# Patient Record
Sex: Male | Born: 1965 | Race: Black or African American | Hispanic: No | Marital: Married | Smoking: Never smoker
Health system: Southern US, Community
[De-identification: ages and names within clinical notes are randomized; demographics above are authoritative.]

## PROBLEM LIST (undated history)

## (undated) ENCOUNTER — Ambulatory Visit

## (undated) DIAGNOSIS — E119 Type 2 diabetes mellitus without complications: Secondary | ICD-10-CM

## (undated) DIAGNOSIS — E079 Disorder of thyroid, unspecified: Secondary | ICD-10-CM

## (undated) DIAGNOSIS — E785 Hyperlipidemia, unspecified: Secondary | ICD-10-CM

## (undated) DIAGNOSIS — I10 Essential (primary) hypertension: Secondary | ICD-10-CM

## (undated) HISTORY — DX: Essential (primary) hypertension: I10

## (undated) HISTORY — PX: DENTAL SURGERY: SHX609

## (undated) HISTORY — DX: Hyperlipidemia, unspecified: E78.5

---

## 2008-08-15 ENCOUNTER — Emergency Department (HOSPITAL_BASED_OUTPATIENT_CLINIC_OR_DEPARTMENT_OTHER): Admission: EM | Admit: 2008-08-15 | Discharge: 2008-08-15 | Payer: Self-pay | Admitting: Emergency Medicine

## 2008-11-19 ENCOUNTER — Emergency Department (HOSPITAL_BASED_OUTPATIENT_CLINIC_OR_DEPARTMENT_OTHER): Admission: EM | Admit: 2008-11-19 | Discharge: 2008-11-19 | Payer: Self-pay | Admitting: Emergency Medicine

## 2008-11-19 ENCOUNTER — Ambulatory Visit: Payer: Self-pay | Admitting: Diagnostic Radiology

## 2009-02-01 ENCOUNTER — Ambulatory Visit: Payer: Self-pay | Admitting: Family Medicine

## 2009-02-01 DIAGNOSIS — I10 Essential (primary) hypertension: Secondary | ICD-10-CM | POA: Insufficient documentation

## 2009-02-01 DIAGNOSIS — H40059 Ocular hypertension, unspecified eye: Secondary | ICD-10-CM

## 2009-02-01 DIAGNOSIS — E039 Hypothyroidism, unspecified: Secondary | ICD-10-CM | POA: Insufficient documentation

## 2009-02-01 DIAGNOSIS — G609 Hereditary and idiopathic neuropathy, unspecified: Secondary | ICD-10-CM | POA: Insufficient documentation

## 2009-02-01 DIAGNOSIS — E119 Type 2 diabetes mellitus without complications: Secondary | ICD-10-CM

## 2009-02-01 DIAGNOSIS — K029 Dental caries, unspecified: Secondary | ICD-10-CM | POA: Insufficient documentation

## 2009-02-04 ENCOUNTER — Ambulatory Visit: Payer: Self-pay | Admitting: Family Medicine

## 2009-02-04 ENCOUNTER — Encounter: Payer: Self-pay | Admitting: Family Medicine

## 2009-02-06 ENCOUNTER — Encounter: Payer: Self-pay | Admitting: Family Medicine

## 2009-02-06 LAB — CONVERTED CEMR LAB
ALT: 13 units/L (ref 0–53)
AST: 17 units/L (ref 0–37)
Alkaline Phosphatase: 59 units/L (ref 39–117)
BUN: 14 mg/dL (ref 6–23)
Calcium: 9.2 mg/dL (ref 8.4–10.5)
Chloride: 104 meq/L (ref 96–112)
Creatinine, Ser: 1.19 mg/dL (ref 0.40–1.50)
HDL: 40 mg/dL (ref >39)
RBC: 4.45 M/uL (ref 4.22–5.81)
TSH: 14.534 microintl units/mL — ABNORMAL HIGH (ref 0.350–4.500)
Total Bilirubin: 0.5 mg/dL (ref 0.3–1.2)
Total CHOL/HDL Ratio: 4.8
VLDL: 13 mg/dL (ref 0–40)
WBC: 5.1 10*3/uL (ref 4.0–10.5)

## 2009-02-11 ENCOUNTER — Telehealth: Payer: Self-pay | Admitting: Family Medicine

## 2009-02-11 ENCOUNTER — Telehealth: Payer: Self-pay | Admitting: *Deleted

## 2009-02-11 DIAGNOSIS — E7849 Other hyperlipidemia: Secondary | ICD-10-CM | POA: Insufficient documentation

## 2009-02-11 DIAGNOSIS — E785 Hyperlipidemia, unspecified: Secondary | ICD-10-CM

## 2009-02-15 ENCOUNTER — Ambulatory Visit: Payer: Self-pay | Admitting: Family Medicine

## 2009-04-12 ENCOUNTER — Ambulatory Visit: Payer: Self-pay | Admitting: Family Medicine

## 2009-04-12 LAB — CONVERTED CEMR LAB: TSH: 0.01 microintl units/mL — ABNORMAL LOW (ref 0.350–4.500)

## 2009-04-15 ENCOUNTER — Telehealth: Payer: Self-pay | Admitting: Family Medicine

## 2009-04-18 ENCOUNTER — Telehealth: Payer: Self-pay | Admitting: Family Medicine

## 2009-06-14 ENCOUNTER — Ambulatory Visit: Payer: Self-pay | Admitting: Family Medicine

## 2009-06-14 DIAGNOSIS — E119 Type 2 diabetes mellitus without complications: Secondary | ICD-10-CM | POA: Insufficient documentation

## 2009-06-17 LAB — CONVERTED CEMR LAB
BUN: 15 mg/dL (ref 6–23)
Creatinine, Ser: 1.12 mg/dL (ref 0.40–1.50)
Glucose, Bld: 98 mg/dL (ref 70–99)
Potassium: 4.7 meq/L (ref 3.5–5.3)
TSH: 0.005 microintl units/mL — ABNORMAL LOW (ref 0.350–4.500)

## 2009-07-08 ENCOUNTER — Telehealth: Payer: Self-pay | Admitting: Family Medicine

## 2009-07-18 ENCOUNTER — Ambulatory Visit: Payer: Self-pay | Admitting: Family Medicine

## 2009-07-18 ENCOUNTER — Encounter: Payer: Self-pay | Admitting: Family Medicine

## 2009-07-22 ENCOUNTER — Telehealth: Payer: Self-pay | Admitting: Family Medicine

## 2009-09-13 ENCOUNTER — Ambulatory Visit: Payer: Self-pay | Admitting: Family Medicine

## 2009-09-13 ENCOUNTER — Encounter: Payer: Self-pay | Admitting: Family Medicine

## 2009-09-18 ENCOUNTER — Encounter: Payer: Self-pay | Admitting: Family Medicine

## 2009-10-03 ENCOUNTER — Telehealth (INDEPENDENT_AMBULATORY_CARE_PROVIDER_SITE_OTHER): Payer: Self-pay | Admitting: *Deleted

## 2009-10-25 ENCOUNTER — Encounter: Payer: Self-pay | Admitting: Family Medicine

## 2009-10-25 ENCOUNTER — Ambulatory Visit: Payer: Self-pay | Admitting: Family Medicine

## 2009-10-25 LAB — CONVERTED CEMR LAB: TSH: 0.088 microintl units/mL — ABNORMAL LOW (ref 0.350–4.500)

## 2009-10-28 ENCOUNTER — Telehealth: Payer: Self-pay | Admitting: Family Medicine

## 2009-11-14 ENCOUNTER — Telehealth: Payer: Self-pay | Admitting: Family Medicine

## 2009-11-26 ENCOUNTER — Telehealth: Payer: Self-pay | Admitting: *Deleted

## 2009-12-03 ENCOUNTER — Telehealth: Payer: Self-pay | Admitting: *Deleted

## 2009-12-04 ENCOUNTER — Ambulatory Visit: Payer: Self-pay | Admitting: Family Medicine

## 2009-12-04 DIAGNOSIS — K047 Periapical abscess without sinus: Secondary | ICD-10-CM | POA: Insufficient documentation

## 2009-12-27 ENCOUNTER — Encounter: Payer: Self-pay | Admitting: *Deleted

## 2009-12-27 ENCOUNTER — Ambulatory Visit: Payer: Self-pay | Admitting: Family Medicine

## 2009-12-27 ENCOUNTER — Encounter: Payer: Self-pay | Admitting: Family Medicine

## 2009-12-27 LAB — CONVERTED CEMR LAB
BUN: 13 mg/dL (ref 6–23)
CO2: 27 meq/L (ref 19–32)
Chloride: 103 meq/L (ref 96–112)
Hgb A1c MFr Bld: 7.7 %
Potassium: 4.4 meq/L (ref 3.5–5.3)

## 2009-12-30 ENCOUNTER — Encounter: Payer: Self-pay | Admitting: Family Medicine

## 2010-02-11 NOTE — Progress Notes (Signed)
Summary: waiting for pt to call back/see message/ts  Phone Note Outgoing Call   Call placed by: Paula Compton MD,  July 22, 2009 8:53 AM Call placed to: Patient Action Taken: Phone Call Completed Summary of Call: Called to patient, TSH is below detectable limit.  I left voice message.  I WISH TO ASK ABOUT THE DOSE OF THE LEVOTHYROXINE HE IS TAKING ON THE MED LIST IS DAILY.  IF HE IS TAKING THIS AS DIRECTED, I WOULD LIKE TO DROP DOWN TO DAILY AND RECHECK HIS TSH IN 6 TO 8 WEEKS.  Initial call taken by: Paula Compton MD,  July 22, 2009 8:54 AM  Follow-up for Phone Call        spoke with patient and gave him message from Dr. Mauricio Po. he has been taking 125 mcg daily of the thyroid medication. advised will call new RX  to MAP program at [facility]. called HD and they are not there today . will call back tomorrow. . Follow-up by: Theresia Lo RN,  July 22, 2009 2:58 PM  Additional Follow-up for Phone Call Additional follow up Details #1::        have called MAP program several  times and left messages X 3. Additional Follow-up by: Theresia Lo RN,  July 23, 2009 11:14 AM    New/Updated Medications: LEVOTHYROXINE SODIUM 75 MCG TABS (LEVOTHYROXINE SODIUM) taken one tablet daily Prescriptions: LEVOTHYROXINE SODIUM 75 MCG TABS (LEVOTHYROXINE SODIUM) taken one tablet daily  #30 x 1   Entered by:   Theresia Lo RN   Authorized by:   Paula Compton MD   Signed by:   Paula Compton MD on 07/24/2009   Method used:   Telephoned to ...       [facility] (retail)             [place], [place]         Ph:        Fax: [phone]   RxID:   [id number]  rx called to health dept MAP program and patient notified. he will return for labs on 09/09/2009.   Phone call and lab plan noted.  Paula Compton MD  July 24, 2009 1:50 PM'

## 2010-02-11 NOTE — Progress Notes (Signed)
Summary: Rx Ques  Phone Note Call from Patient Call back at Home Phone [phone]   Caller: Patient Summary of Call: Gabapentin has questions about the dosage he should be taking.  The dosage is to low now. Initial call taken by: Clydell Hakim,  April 18, 2009 8:50 AM  Follow-up for Phone Call        he was given 100mg  caps. I explained that he takes 3 of those 3 times a day so the dose is actualy higher. he was satisfied with answer Follow-up by: Golden Circle RN,  April 18, 2009 8:56 AM

## 2010-02-11 NOTE — Progress Notes (Signed)
Summary: dental referral/ts  Phone Note Call from Patient Call back at Home Phone [phone]   Caller: Patient Summary of Call: pt is requesting to be referred to dentist Initial call taken by: De Nurse,  December 03, 2009 11:09 AM  Follow-up for Phone Call        called pt. pt c/o pain (wisdom teeth). advised pt to sched. ov to be evaluated and we can send the referral to the dental clinic. may need to be put on abx. pt agreed and will sched.ov. Follow-up by: Arlyss Repress CMA,,  December 03, 2009 11:29 AM

## 2010-02-11 NOTE — Progress Notes (Signed)
Summary: Rx Ques  Phone Note Call from Patient Call back at Home Phone [phone]   Caller: Patient Summary of Call: Needs to know what milligrams of snythroid he is supposed to be taking. Initial call taken by: Clydell Hakim,  October 03, 2009 1:37 PM  Follow-up for Phone Call        message left on voicemail to call back. Follow-up by: Theresia Lo RN,  October 03, 2009 2:13 PM  Additional Follow-up for Phone Call Additional follow up Details #1::        spoke with patient and he states he picked up from MAP last week synthroid 0.15 mg. advised him this is the correct dosage that he is to be on. Additional Follow-up by: Theresia Lo RN,  October 03, 2009 2:28 PM

## 2010-02-11 NOTE — Assessment & Plan Note (Signed)
Summary: f/u eo   Vital Signs:  Patient profile:   45 year old male Height:      69 inches Weight:      161 pounds BMI:     23.86 Pulse rate:   74 / minute BP sitting:   114 / 76  (left arm)  Vitals Entered By: Arlyss Repress CMA, (April 12, 2009 3:47 PM) CC: f/up DM/HTN Is Patient Diabetic? Yes Pain Assessment Patient in pain? yes     Location: feet Intensity: 4 Onset of pain  x 1 yr   CC:  f/up DM/HTN.  Habits & Providers  Alcohol-Tobacco-Diet     Tobacco Status: never  Current Medications (verified): 1)  Metformin Hcl 1000 Mg Tabs (Metformin Hcl) .Marland Kitchen.. 1 By Mouth Two Times Daily 2)  Glipizide 10 Mg Tabs (Glipizide) .... Sig: Take 1 Tab By Mouth Two Times A Day 3)  Lisinopril 10 Mg Tabs (Lisinopril) .Marland Kitchen.. 1 By Mouth Once Daily 4)  Tramadol Hcl 50 Mg Tabs (Tramadol Hcl) .... Sig Take 1 Tab By Mouth Every 6 To 12 Hours As Needed For Pain 5)  Levothyroxine Sodium 200 Mcg Tabs (Levothyroxine Sodium) .... Sig: Take 1 Tab By Mouth One Time Daily 6)  Gabapentin 100 Mg Caps (Gabapentin) .... Sig: Take 3 Caps By Mouth Three Times Daily On The $10 For 90-Day List 7)  Pravastatin Sodium 40 Mg Tabs (Pravastatin Sodium) .Marland Kitchen.. 1 By Mouth Once Daily  Allergies (verified): No Known Drug Allergies  Physical Exam  General:  Well-developed,well-nourished,in no acute distress; alert,appropriate and cooperative throughout examination Head:  Normocephalic and atraumatic without obvious abnormalities. No apparent alopecia or balding. Mouth:  Oral mucosa and oropharynx without lesions or exudates.  Teeth in poor repair. Neck:  No deformities, masses, or tenderness noted. Lungs:  Normal respiratory effort, chest expands symmetrically. Lungs are clear to auscultation, no crackles or wheezes. Heart:  Normal rate and regular rhythm. S1 and S2 normal without gallop, murmur, click, rub or other extra sounds. Pulses:  palpable dp pulses bilaterally Extremities:  No clubbing, cyanosis, edema, or  deformity noted with normal full range of motion of all joints.    Diabetes Management Exam:    Foot Exam (with socks and/or shoes not present):       Sensory-Monofilament:          Left foot: diminished          Right foot: diminished       Sensory-other: diminished to absent monofilament exam at bases of 1/3/5 MTP joints bilaterally.  No open skin lesions or areas at particular risk for ulceration       Inspection:          Left foot: normal          Right foot: normal       Nails:          Left foot: normal          Right foot: normal   Impression & Recommendations:  Problem # 1:  HYPERLIPIDEMIA (ICD-272.4)  Increase pravastatin.  He is applying for MAP program.  Plan to change to Lipitor when he gets on the MAP program.  The following medications were removed from the medication list:    Pravastatin Sodium 20 Mg Tabs (Pravastatin sodium) .Marland Kitchen... 1 by mouth at bedtime His updated medication list for this problem includes:    Pravastatin Sodium 40 Mg Tabs (Pravastatin sodium) .Marland Kitchen... 1 by mouth once daily  Orders: [facility]- Est  Level  4 (10272)  Problem # 2:  HYPERTENSION, BENIGN ESSENTIAL (ICD-401.1)  At goal. Continue lisinopril.  His updated medication list for this problem includes:    Lisinopril 10 Mg Tabs (Lisinopril) .Marland Kitchen... 1 by mouth once daily  Orders: [facility]- Est  Level 4 (53664)  Problem # 3:  PERIPHERAL NEUROPATHY (ICD-356.9)  Improved with gabapentin, plan to increase dose. Cost issues with the 300mg  capsules.  I see the 100mg  [facility], will change dosing for this.  MAP Program to help with htis.   Orders: [facility]- Est  Level 4 (40347)  Problem # 4:  HYPOTHYROIDISM (ICD-244.9) Very high TSH before; we increased his dose of LT4.  He states he has been taking every day, whereas previously he had not been taking daily.  Plan to recheck TSH and determine dose change.  His updated medication list for this problem includes:    Levothyroxine Sodium 200 Mcg Tabs  (Levothyroxine sodium) ..... Sig: take 1 tab by mouth one time daily  Orders: [facility] (42595-63875) [facility]- Est  Level 4 (64332)  Problem # 5:  DIABETES MELLITUS, TYPE II (ICD-250.00)  Well controlled by A1C.  Continue with current plan.  His updated medication list for this problem includes:    Metformin Hcl 1000 Mg Tabs (Metformin hcl) .Marland Kitchen... 1 by mouth two times daily    Glipizide 10 Mg Tabs (Glipizide) ..... Sig: take 1 tab by mouth two times a day    Lisinopril 10 Mg Tabs (Lisinopril) .Marland Kitchen... 1 by mouth once daily  Orders: [facility]- Est  Level 4 (95188)  Complete Medication List: 1)  Metformin Hcl 1000 Mg Tabs (Metformin hcl) .Marland Kitchen.. 1 by mouth two times daily 2)  Glipizide 10 Mg Tabs (Glipizide) .... Sig: take 1 tab by mouth two times a day 3)  Lisinopril 10 Mg Tabs (Lisinopril) .Marland Kitchen.. 1 by mouth once daily 4)  Tramadol Hcl 50 Mg Tabs (Tramadol hcl) .... Sig take 1 tab by mouth every 6 to 12 hours as needed for pain 5)  Levothyroxine Sodium 200 Mcg Tabs (Levothyroxine sodium) .... Sig: take 1 tab by mouth one time daily 6)  Gabapentin 100 Mg Caps (Gabapentin) .... Sig: take 3 caps by mouth three times daily on the $10 for 90-day list 7)  Pravastatin Sodium 40 Mg Tabs (Pravastatin sodium) .Marland Kitchen.. 1 by mouth once daily  Patient Instructions: 1)  It was a pleasure to see you today.  2)  I have [facility] the [facility] [address].  It is for 270 capsules, at $10. 3)  I would like to check a cholesterol panel in 3 months.  Let me know when the MAP program gets you situated and we can see if we should change some of your meds.  Prescriptions: PRAVASTATIN SODIUM 40 MG TABS (PRAVASTATIN SODIUM) 1 by mouth once daily  #30 x 6   Entered and Authorized by:   Paula Compton MD   Signed by:   Paula Compton MD on 04/12/2009   Method used:   Electronically to        [facility] (retail)       [address]       [place], [place]   [address]       Ph: [phone]       Fax: [phone]   RxID:   [id number] GABAPENTIN 100 MG CAPS (GABAPENTIN) SIG: Take 3 caps by mouth  three times daily On the $10 for 90-day list  #270 x 1   Entered and Authorized by:   Paula Compton MD   Signed by:   Paula Compton MD on 04/12/2009   Method used:   [facility]  [address] [id number]       [address]       [place], [place]  [address]       Ph: [phone] or [phone]       Fax: [phone]   RxID:   [id number] GABAPENTIN 300 MG CAPS (GABAPENTIN) SIG: Take 1 cap by mouth three times a day  #90 x 6   Entered and Authorized by:   Paula Compton MD   Signed by:   Paula Compton MD on 04/12/2009   Method used:   Electronically to        [facility] (retail)       [address]       [place], [place]  [address]       Ph: [phone]       Fax: [phone]   RxID:   [id number]    Prevention & Chronic Care Immunizations   Influenza vaccine: Fluvax Non-MCR  (02/15/2009)    Tetanus booster: Not documented    Pneumococcal vaccine: Not documented  Other Screening   Smoking status: never  (04/12/2009)  Diabetes Mellitus   HgbA1C: 6.9  (02/01/2009)    Eye exam: difficult to appreciate  (02/01/2009)   Eye exam due: 02/2010    Foot exam: yes  (04/12/2009)   High risk foot: Not documented   Foot care education: Not documented    Urine microalbumin/creatinine ratio: Not documented    Diabetes flowsheet reviewed?: Yes   Progress toward A1C goal: At goal  Lipids   Total Cholesterol: 190  (02/04/2009)   LDL: 137  (02/04/2009)   LDL Direct: Not documented   HDL: 40  (02/04/2009)   Triglycerides: 66  (02/04/2009)    SGOT (AST): 17  (02/04/2009)   SGPT (ALT): 13  (02/04/2009)   Alkaline phosphatase: 59  (02/04/2009)   Total bilirubin: 0.5  (02/04/2009)    Lipid flowsheet reviewed?: Yes   Progress toward LDL goal:  Unchanged  Hypertension   Last Blood Pressure: 114 / 76  (04/12/2009)   Serum creatinine: 1.19  (02/04/2009)   Serum potassium 4.6  (02/04/2009)    Hypertension flowsheet reviewed?: Yes   Progress toward BP goal: At goal  Self-Management Support :   Personal Goals (by the next clinic visit) :     Personal A1C goal: 8  (02/01/2009)     Personal blood pressure goal: 130/80  (02/01/2009)     Personal LDL goal: 100  (02/01/2009)    Diabetes self-management support: Written self-care plan  (02/01/2009)    Hypertension self-management support: Written self-care plan  (02/01/2009)    Lipid self-management support: Written self-care plan  (02/01/2009)

## 2010-02-11 NOTE — Letter (Signed)
Summary: Generic Letter  [facility]  [address]   [place], [place] [address]   Phone: [phone]  Fax: [phone]    02/06/2009  Ricardo Forbes [address] [place], [place]  [address]  Dear [name],   It was a pleasure to see you in the office last week.  I have received the results of your labwork.  There are only two items I would like to address from the labwork.  1) Our goal for the LDL "bad" cholesterol is less than 100.  Yours is above this, at 139. I recommend that we increase your pravastatin from 20mg  to 40mg  once daily and recheck the cholesterol panel, fasting, in 2 months.  2) The TSH value is very high, which often means that the patient is not taking enough of the levothyroxine. I would be interested to know if you have been out of this medication, or if you have discontinued it for some other reason.  If you have been taking the daily all along, then we should increase your dose.  Please call the office at [phone] to discuss these issues; also, please leave the best number where you can be reached.    Sincerely,   Paula Compton MD  Appended Document: Generic Letter mailed.

## 2010-02-11 NOTE — Progress Notes (Signed)
Summary: refill- readdress  Phone Note Refill Request Call back at Home Phone [phone] Message from:  Patient  Refills Requested: Medication #1:  LISINOPRIL 10 MG TABS 1 by mouth once daily  Medication #2:  PRAVASTATIN SODIUM 40 MG TABS 1 by mouth once daily Initial call taken by: De Nurse,  November 14, 2009 2:39 PM  Follow-up for Phone Call        pt wants meds sent to [facility] instead- too high  Follow-up by: De Nurse,  November 14, 2009 4:01 PM    Prescriptions: PRAVASTATIN SODIUM 40 MG TABS (PRAVASTATIN SODIUM) 1 by mouth once daily  #30 x 6   Entered and Authorized by:   Paula Compton MD   Signed by:   Paula Compton MD on 11/15/2009   Method used:   Electronically to        [facility] (retail)       [address]       [place], [place]  [address]       Ph: [phone]       Fax: [phone]   RxID:   [id number] LISINOPRIL 10 MG TABS (LISINOPRIL) 1 by mouth once daily  #90 x 3   Entered and Authorized by:   Paula Compton MD   Signed by:   Paula Compton MD on 11/15/2009   Method used:   Electronically to        [facility] (retail)       [address]       [place], [place]  [address]       Ph: [phone]       Fax: [phone]   RxID:   [id number] PRAVASTATIN SODIUM 40 MG TABS (PRAVASTATIN SODIUM) 1 by mouth once daily  #30 x 6   Entered and Authorized by:   Paula Compton MD   Signed by:   Paula Compton MD on 11/14/2009   Method used:   [facility]  [address] [id number]       [address]       [place], [place]  [address]       Ph: [phone] or [phone]       Fax: [phone]   RxID:   [id number] LISINOPRIL 10 MG TABS (LISINOPRIL) 1 by mouth once daily  #90 x 3   Entered and Authorized by:   Paula Compton MD   Signed by:   Paula Compton MD on 11/14/2009   Method used:   [facility]  [address] [id number]       [address]       [place], [place]  [address]       Ph: [phone] or [phone]       Fax: [phone]   RxID:   [id number]  Prescriptions resent to [facility] on [address]. Paula Compton MD  November 15, 2009 8:40 AM

## 2010-02-11 NOTE — Assessment & Plan Note (Signed)
Summary: NP,tcb   Vital Signs:  Patient profile:   45 year old male Weight:      161 pounds Temp:     98.1 degrees F oral Pulse rate:   76 / minute BP sitting:   135 / 87  (left arm) Cuff size:   regular  Vitals Entered By: Tessie Fass CMA (February 01, 2009 2:14 PM) CC: new patient, diabetes Is Patient Diabetic? Yes Pain Assessment Patient in pain? yes     Location: legs Intensity: 5   CC:  new patient and diabetes.  History of Present Illness: New patient visit with excess of 30 minutes direct face-to-face patient contact.   [name] is a new patient to our office, comes in with his wife and two children. He had been a patient of Dr Willa Rough at [facility] before. Comes in for establishment of care, has not seen a doctor regularly since he lost his medicaid in 2006.  At that time he was followed for DM, HTN, hyperlipidemia, and reports he was told he had glaucoma OU by his optometrist at [facility].  However, he has not seen an ophthalmologist.   He cannot recall when he last saw a dentist.  Has had tooth decay but without pain.   His greatest problem has been his bilateral foot pain, diagnosed previously as diabetic neuropathy.  Burning foot pain that is overly sensitive to light touch.  Started gabapentin, has not seen an improvemnet.  In the past he has taken tramadol as well aspercocet, both of these gave him some relief.   Habits & Providers  Alcohol-Tobacco-Diet     Tobacco Status: never  Current Medications (verified): 1)  Metformin Hcl 1000 Mg Tabs (Metformin Hcl) .Marland Kitchen.. 1 By Mouth Two Times Daily 2)  Glipizide 10 Mg Tabs (Glipizide) .... Sig: Take 1 Tab By Mouth Two Times A Day 3)  Gabapentin 300 Mg Caps (Gabapentin) .... Sig: Take 1 Cap By Mouth Two Times A Day 4)  Levothroid 175 Mcg Tabs (Levothyroxine Sodium) .... Sig: 1 By Mouth Once Daily 5)  Lisinopril 10 Mg Tabs (Lisinopril) .Marland Kitchen.. 1 By Mouth Once Daily 6)   Pravastatin Sodium 20 Mg Tabs (Pravastatin Sodium) .Marland Kitchen.. 1 By Mouth At Bedtime 7)  Tramadol Hcl 50 Mg Tabs (Tramadol Hcl) .... Sig Take 1 Tab By Mouth Every 6 To 12 Hours As Needed For Pain  Allergies (verified): No Known Drug Allergies  Family History: Reviewed history from 02/01/2009 and no changes required. Father with heart disease at young age, per patient.  Mother with DM. Father with stroke. Denies aortic aneurysm, breast/colon/prostate cancer, or sudden death.  Father died at age 15 of CVA vs MI  Social History: Reviewed history from 02/01/2009 and no changes required. Not working. 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 Never Smoked Alcohol use-no Drug use-no Married, lives with wife, daughter, son, and step-daughter. Wife Ricardo Forbes ([place] [address]10), step-daughter Ricardo Forbes (YOB 1996), son Ricardo Forbes (YOB 2002), daughter Ricardo Forbes [phone] 2003).  Review of Systems       The patient complains of vision loss.  The patient denies fever, chest pain, dyspnea on exertion, peripheral edema, prolonged cough, abdominal pain, and unusual weight change.    Physical Exam  General:  alert, pleasant, cooperative,  No apparent distress. Eyes:  EOMI, PERRL.  Difficult to appreciate fundi with undilated exam. Ears:  External ear exam shows no  significant lesions or deformities.  Otoscopic examination reveals clear canals, tympanic membranes are intact bilaterally without bulging, retraction, inflammation or discharge. Hearing is grossly normal bilaterally. Mouth:  clear oropharynx.  Very poor dentition, with deteriorated molars to root on R lower, among others. No visible erythema or apparent abscess.  Neck:  neck supple. no adenopathy Lungs:  Normal respiratory effort, chest expands symmetrically. Lungs are clear to auscultation, no crackles or wheezes. Heart:  Normal rate and regular rhythm. S1 and S2 normal without  gallop, murmur, click, rub or other extra sounds. Abdomen:  Bowel sounds positive,abdomen soft and non-tender without masses, organomegaly or hernias noted. Pulses:  palpable dp pulses bilaterally Extremities:  mild maceration between 3rd and 4th, 4th and 5th toes on R foot.  No other skin lesions on feet.  Neurologic:  see DM foot exam  Diabetes Management Exam:    Foot Exam (with socks and/or shoes not present):       Sensory-Pinprick/Light touch:          Left medial foot (L-4): normal          Left dorsal foot (L-5): normal          Left lateral foot (S-1): normal          Right medial foot (L-4): normal          Right dorsal foot (L-5): normal          Right lateral foot (S-1): normal       Sensory-Monofilament:          Left foot: diminished          Right foot: diminished       Inspection:          Left foot: normal          Right foot: normal       Nails:          Left foot: normal          Right foot: normal    Eye Exam:       Eye Exam done here today          Results: difficult to appreciate   Impression & Recommendations:  Problem # 1:  DIABETES MELLITUS, TYPE II (ICD-250.00) Patient with DM, unclear control.  Ran out of sulfonylurea this morning.  TO refill meds, check A1c and other baseline labs.  WIll order ECG in coming visits for baseline. MIcroalbumin, lipids, metabolic panel.  To start ASA 81mg  once daily at next visit.  His updated medication list for this problem includes:    Metformin Hcl 1000 Mg Tabs (Metformin hcl) .Marland Kitchen... 1 by mouth two times daily    Glipizide 10 Mg Tabs (Glipizide) ..... Sig: take 1 tab by mouth two times a day    Lisinopril 10 Mg Tabs (Lisinopril) .Marland Kitchen... 1 by mouth once daily  Orders: A1C-[facility] (83036)Future Orders: Lipid-[facility] ([id number]) ... 01/17/2010 Comp Met-[facility] ([id number]) ... 01/17/2010 CBC-[facility] ([id number]) ... 01/22/2010 TSH-[facility] [id number]) ... 01/31/2010 UA Microalbumin-[facility] ([id number]) ... 01/23/2010  Problem # 2:  HYPERTENSION,  BENIGN ESSENTIAL (ICD-401.1) Patient with history HTN, ran outof lisinopril about 1 month ago.  Restart. Check renal fxn, urine microalbumin. His updated medication list for this problem includes:    Lisinopril 10 Mg Tabs (Lisinopril) .Marland Kitchen... 1 by mouth once daily  Future Orders: UA Microalbumin-[facility] ([id number]) ... 01/23/2010  Problem # 3:  PERIPHERAL NEUROPATHY (ICD-356.9) Patient on low-dose gabapentin for neuropathy, which is confirmed by monofilament exam showing diminished sensation  on R at great toe base, 5th toe base, similar on L foot.  Will increase gabapentin, attempt to obtain aat MAP program.    Problem # 4:  HYPOTHYROIDISM (ICD-244.9) Iatrogenic hypothyroidism after RAIA for hyperthyroidism.  No labs since 2006.  Will check TSH and adjust LT4 accordingly.  His updated medication list for this problem includes:    Levothroid 175 Mcg Tabs (Levothyroxine sodium) ..... 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  Now with concerns regarding diminished vision.  For ophthalmologist referral. Orders: Ophthalmology Referral (Ophthalmology)  Problem # 6:  UNSPECIFIED DENTAL CARIES (ICD-521.00) Patient with very poor dentition and DM.  Referred to [facility] for contacts with dentists who offer services to uninsured.   Complete Medication List: 1)  Metformin Hcl 1000 Mg Tabs (Metformin hcl) .Marland Kitchen.. 1 by mouth two times daily 2)  Glipizide 10 Mg Tabs (Glipizide) .... Sig: take 1 tab by mouth two times a day 3)  Gabapentin 300 Mg Caps (Gabapentin) .... Sig: take 1 cap by mouth two times a day 4)  Levothroid 175 Mcg Tabs (Levothyroxine sodium) .... Sig: 1 by mouth once daily 5)  Lisinopril 10 Mg Tabs (Lisinopril) .Marland Kitchen.. 1 by mouth once daily 6)  Pravastatin Sodium 20 Mg Tabs (Pravastatin sodium) .Marland Kitchen.. 1 by mouth at bedtime 7)  Tramadol Hcl 50 Mg Tabs (Tramadol hcl)  .... Sig take 1 tab by mouth every 6 to 12 hours as needed for pain Prescriptions: TRAMADOL HCL 50 MG TABS (TRAMADOL HCL) SIG Take 1 tab by mouth every 6 to 12 hours as needed for pain  #100 x 0   Entered and Authorized by:   Paula Compton MD   Signed by:   Paula Compton MD on 02/01/2009   Method used:   Print then Give to Patient   RxID:   [id number] GABAPENTIN 300 MG CAPS (GABAPENTIN) SIG: Take 1 cap by mouth two times a day  #60 x 6   Entered and Authorized by:   Paula Compton MD   Signed by:   Paula Compton MD on 02/01/2009   Method used:   Print then Give to Patient   RxID:   [id number] LISINOPRIL 10 MG TABS (LISINOPRIL) 1 by mouth once daily  #90 x 3   Entered and Authorized by:   Paula Compton MD   Signed by:   Paula Compton MD on 02/01/2009   Method used:   Electronically to        [facility] (retail)       [address]       [place], [place]  [address]       Ph: [phone]       Fax: [phone]   RxID:   [id number] LEVOTHROID 175 MCG TABS (LEVOTHYROXINE SODIUM) SIG: 1 by mouth once daily  #30 x 6   Entered and Authorized by:   Paula Compton MD   Signed by:   Paula Compton MD on 02/01/2009   Method used:   Electronically to        [facility] (retail)       [address]       [place], [place]  [address]       Ph: [phone]       Fax: [phone]  RxID:   [id number] GABAPENTIN 300 MG CAPS (GABAPENTIN) SIG: Take 1 cap by mouth two times a day  #60 x 6   Entered and Authorized by:   Paula Compton MD   Signed by:   Paula Compton MD on 02/01/2009   Method used:   Electronically to        [facility] (retail)       [address]       [place], [place]  [address]       Ph: [phone]       Fax: [phone]   RxID:   [id number] GLIPIZIDE 10 MG TABS (GLIPIZIDE) SIG: take 1 tab by mouth two times a day  #180 x 3   Entered and Authorized by:   Paula Compton MD    Signed by:   Paula Compton MD on 02/01/2009   Method used:   Electronically to        [facility] (retail)       [address]       [place], [place]  [address]       Ph: [phone]       Fax: [phone]   RxID:   [id number] METFORMIN HCL 1000 MG TABS (METFORMIN HCL) 1 by mouth two times daily  #180 x 3   Entered and Authorized by:   Paula Compton MD   Signed by:   Paula Compton MD on 02/01/2009   Method used:   Electronically to        [facility] (retail)       [address]       [place], [place]  [address]       Ph: [phone]       Fax: [phone]   RxID:   [id number]   Laboratory Results   Blood Tests   Date/Time Received: February 01, 2009 3:03 PM  Date/Time Reported: February 01, 2009 3:11 PM   HGBA1C: 6.9%   (Normal Range: Non-Diabetic - 3-6%   Control Diabetic - 6-8%)  Comments: ...........test performed by...........Marland KitchenTerese Door, CMA        Prevention & Chronic Care Immunizations   Influenza vaccine: Not documented    Tetanus booster: Not documented    Pneumococcal vaccine: Not documented  Other Screening   Smoking status: never  (02/01/2009)  Diabetes Mellitus   HgbA1C: 6.9  (02/01/2009)    Eye exam: difficult to appreciate  (02/01/2009)   Eye exam due: 02/2010    Foot exam: yes  (02/01/2009)   High risk foot: Not documented   Foot care education: Not documented    Urine microalbumin/creatinine ratio: Not documented  Lipids   Total Cholesterol: Not documented   LDL: Not documented   LDL Direct: Not documented   HDL: Not documented   Triglycerides: Not documented  Hypertension   Last Blood Pressure: 135 / 87  (02/01/2009)   Serum creatinine: Not documented   Serum potassium Not documented CMP ordered   Self-Management Support :   Personal Goals (by the next clinic visit) :     Personal A1C goal: 8  (02/01/2009)     Personal blood pressure goal: 130/80   (02/01/2009)     Personal LDL goal: 100  (02/01/2009)    Patient will work on the following items until the next clinic visit to  reach self-care goals:     Medications and monitoring: take my medicines every day, bring all of my medications to every visit  (02/01/2009)     Eating: drink diet soda or water instead of juice or soda, eat more vegetables  (02/01/2009)    Diabetes self-management support: Written self-care plan  (02/01/2009)   Diabetes care plan printed    Hypertension self-management support: Written self-care plan  (02/01/2009)   Hypertension self-care plan printed.

## 2010-02-11 NOTE — Assessment & Plan Note (Signed)
Summary: flu shot/eo  Nurse Visit   Vital Signs:  Patient profile:   45 year old male Temp:     98.3 degrees F  Vitals Entered By: Theresia Lo RN (February 15, 2009 10:03 AM)  Allergies: No Known Drug Allergies  Immunizations Administered:  Influenza Vaccine # 1:    Vaccine Type: Fluvax Non-MCR    Site: left deltoid    Mfr: GlaxoSmithKline    Dose: 0.5 ml    Route: IM    Given by: Theresia Lo RN    Exp. Date: 07/11/2009    Lot #: [id number]    VIS given: 08/21/2008  Flu Vaccine Consent Questions:    Do you have a history of severe allergic reactions to this vaccine? no    Any prior history of allergic reactions to egg and/or gelatin? no    Do you have a sensitivity to the preservative Thimersol? no    Do you have a past history of Guillan-Barre Syndrome? no    Do you currently have an acute febrile illness? no    Have you ever had a severe reaction to latex? no    Vaccine information given and explained to patient? yes  Orders Added: 1)  Influenza Vaccine NON MCR [00028] 2)  Admin 1st Vaccine [65784]

## 2010-02-11 NOTE — Progress Notes (Signed)
Summary: refill  Phone Note Refill Request Call back at Home Phone [phone] Message from:  Patient  Refills Requested: Medication #1:  GABAPENTIN 100 MG CAPS SIG: Take 3 caps by mouth three times daily On the $10 for 90-day list [facility] [address]  Initial call taken by: De Nurse,  July 08, 2009 11:30 AM    Prescriptions: GABAPENTIN 100 MG CAPS (GABAPENTIN) SIG: Take 3 caps by mouth three times daily On the $10 for 90-day list  #270 x 6   Entered and Authorized by:   Paula Compton MD   Signed by:   Paula Compton MD on 07/09/2009   Method used:   Electronically to        [facility]  [id number] ([facility])       [address]       [place], [place]  [address]       Ph: [phone] or [phone]       Fax: [phone]   RxID:   [id number]

## 2010-02-11 NOTE — Progress Notes (Signed)
  Phone Note Outgoing Call   Call placed by: Paula Compton MD,  February 11, 2009 3:28 PM Call placed to: Patient Summary of Call: Called patient, he had run out of Levothyroxine two weeks before his visit and labs.  Had been taking 1 tab one time daily before running out. Will increase dose, recheck along with fasting lipid panel in 2 months (8 weeks).  He agrees to this.  Initial call taken by: Paula Compton MD,  February 11, 2009 3:29 PM  New Problems: HYPERLIPIDEMIA (ICD-272.4)   New Problems: HYPERLIPIDEMIA (ICD-272.4) New/Updated Medications: LEVOTHYROXINE SODIUM 200 MCG TABS (LEVOTHYROXINE SODIUM) SIG: Take 1 tab by mouth one time daily Prescriptions: LEVOTHYROXINE SODIUM 200 MCG TABS (LEVOTHYROXINE SODIUM) SIG: Take 1 tab by mouth one time daily  #30 x 6   Entered and Authorized by:   Paula Compton MD   Signed by:   Paula Compton MD on 02/11/2009   Method used:   Electronically to        [facility] (retail)       [address]       [place], [place]  [address]       Ph: [phone]       Fax: [phone]   RxID:   [id number]

## 2010-02-11 NOTE — Miscellaneous (Signed)
Summary: Pre-load Health History Form  Clinical Lists Changes  Problems: Added new problem of DIABETES MELLITUS, TYPE II (ICD-250.00) Added new problem of HYPOTHYROIDISM (ICD-244.9) Added new problem of PERIPHERAL NEUROPATHY (ICD-356.9) Medications: Added new medication of METFORMIN HCL 1000 MG TABS (METFORMIN HCL) 1 by mouth two times daily Added new medication of GLIPIZIDE 10 MG TABS (GLIPIZIDE) SIG: take 1 tab by mouth two times a day - Signed Added new medication of GABAPENTIN 300 MG CAPS (GABAPENTIN) SIG: Take 1 cap by mouth two times a day - Signed Added new medication of LEVOTHROID 175 MCG TABS (LEVOTHYROXINE SODIUM) SIG: 1 by mouth once daily - Signed Added new medication of LISINOPRIL 10 MG TABS (LISINOPRIL) 1 by mouth once daily Added new medication of PRAVASTATIN SODIUM 20 MG TABS (PRAVASTATIN SODIUM) 1 by mouth at bedtime - Signed Rx of GLIPIZIDE 10 MG TABS (GLIPIZIDE) SIG: take 1 tab by mouth two times a day;  #60 x 6;  Signed;  Entered by: Paula Compton MD;  Authorized by: Paula Compton MD;  Method used: Historical Rx of GABAPENTIN 300 MG CAPS (GABAPENTIN) SIG: Take 1 cap by mouth two times a day;  #60 x 6;  Signed;  Entered by: Paula Compton MD;  Authorized by: Paula Compton MD;  Method used: Historical Rx of LEVOTHROID 175 MCG TABS (LEVOTHYROXINE SODIUM) SIG: 1 by mouth once daily;  #30 x 6;  Signed;  Entered by: Paula Compton MD;  Authorized by: Paula Compton MD;  Method used: Historical Rx of PRAVASTATIN SODIUM 20 MG TABS (PRAVASTATIN SODIUM) 1 by mouth at bedtime;  #30 x 6;  Signed;  Entered by: Paula Compton MD;  Authorized by: Paula Compton MD;  Method used: Historical Observations: Added new observation of PMH NEUROPTH: yes (02-11-2009 12:01) Added new observation of HYPOTHYRDHX: yes (11-Feb-2009 12:01) Added new observation of DMTYPEIIHX: yes (02-11-09 12:01) Added new observation of DRUG USE: no (Feb 11, 2009 12:01) Added new observation of ALCOHOL COM: no (2009/02/11 12:01) Added new  observation of SMOK STATUS: never (2009-02-11 12:01) Added new observation of FAMILY HX: Father with heart disease at young age, per patient.  Mother with DM. Father with stroke. Denies aortic aneurysm, breast/colon/prostate cancer, or sudden death. (02/11/09 12:01) Added new observation of SOCIAL HX: Not working.  Plays drums at church. Never Smoked Alcohol use-no Drug use-no Married, lives with wife, daughter, son, and step-daughter. Wife Vashon ([place] 2130), step-daughter Trudie Reed (YOB 1996), son Shar'Dayrias Rekowski (YOB 2002), daughter Wylder Macomber [phone] 2003). (Feb 11, 2009 12:01) Added new observation of SURGHX DENIE: yes (2009/02/11 12:01) Added new observation of PSH REVIEWED: reviewed - no changes required (Feb 11, 2009 12:01) Added new observation of PAST SURG HX: none Denies surgical history  (02/11/09 12:01) Added new observation of PAST MED HX: glaucoma OU; memory loss; diabetic neuropathy. Hypothyroidism, s/p RAIA for hyperthyroid, now on supplemental LT4. Diabetes mellitus, type II Hypothyroidism Peripheral neuropathy  (02/11/2009 12:01)    Prescriptions: PRAVASTATIN SODIUM 20 MG TABS (PRAVASTATIN SODIUM) 1 by mouth at bedtime  #30 x 6   Entered and Authorized by:   Paula Compton MD   Signed by:   Paula Compton MD on 02-11-09   Method used:   Historical   RxID:   [id number] LEVOTHROID 175 MCG TABS (LEVOTHYROXINE SODIUM) SIG: 1 by mouth once daily  #30 x 6   Entered and Authorized by:   Paula Compton MD   Signed by:   Paula Compton MD on 2009/02/11   Method used:   Historical   RxID:  [id number] GABAPENTIN 300 MG CAPS (GABAPENTIN) SIG: Take 1 cap by mouth two times a day  #60 x 6   Entered and Authorized by:   Paula Compton MD   Signed by:   Paula Compton MD on 02/01/2009   Method used:   Historical   RxID:   [id number] GLIPIZIDE 10 MG TABS (GLIPIZIDE) SIG: take 1 tab by mouth two times a day  #60 x 6   Entered and Authorized by:   Paula Compton MD   Signed by:   Paula Compton MD on 02/01/2009   Method used:   Historical   RxID:   [id number]     Past History:  Past Medical History: glaucoma OU; memory loss; diabetic neuropathy. Hypothyroidism, s/p RAIA for hyperthyroid, now on supplemental LT4. Diabetes mellitus, type II Hypothyroidism Peripheral neuropathy  Past Surgical History: none Denies surgical history   Family History: Father with heart disease at young age, per patient.  Mother with DM. Father with stroke. Denies aortic aneurysm, breast/colon/prostate cancer, or sudden death.  Social History: Not working.  Plays drums at church. Never Smoked Alcohol use-no Drug use-no Married, lives with wife, daughter, son, and step-daughter. Wife Volin ([place] 6[address]), step-daughter Trudie Reed (YOB 1996), son Shar'Dayrias Ladouceur (YOB 2002), daughter Evan Mackie [phone] 2003).Smoking Status:  never Drug Use:  no

## 2010-02-11 NOTE — Assessment & Plan Note (Signed)
Summary: f/u,df   Vital Signs:  Patient profile:   45 year old male Height:      69 inches Weight:      162 pounds BMI:     24.01 Temp:     98.2 degrees F oral Pulse rate:   95 / minute BP sitting:   103 / 70  (right arm) Cuff size:   regular  Vitals Entered By: Tessie Fass CMA (June 14, 2009 4:08 PM) CC: F/U diabetes Is Patient Diabetic? Yes Pain Assessment Patient in pain? no        CC:  F/U diabetes.  History of Present Illness: Here for followup.  His bilateral peripheral neuropathy is mildly improved since starting the gabapentin.  Is concerned about a bony prominence that he can feel along the lateral aspect of the L foot; thinks it appears mildly red.  Wears supportive athletic shoes most of the time.  No swellingin feet.   Saw ophthalmologist recently, who told him that he does not have glaucoma.  Doesn't recall the name of the practice or the doctor.   Discussed results of lastTSH, which was low.  Before that, he had a high TSH.  Since med adjustment he has been taking his LT4 daily.   Habits & Providers  Alcohol-Tobacco-Diet     Tobacco Status: never  Current Medications (verified): 1)  Metformin Hcl 1000 Mg Tabs (Metformin Hcl) .Marland Kitchen.. 1 By Mouth Two Times Daily 2)  Glipizide 10 Mg Tabs (Glipizide) .... Sig: Take 1 Tab By Mouth Two Times A Day 3)  Lisinopril 10 Mg Tabs (Lisinopril) .Marland Kitchen.. 1 By Mouth Once Daily 4)  Tramadol Hcl 50 Mg Tabs (Tramadol Hcl) .... Sig Take 1 Tab By Mouth Every 6 To 12 Hours As Needed For Pain 5)  Gabapentin 100 Mg Caps (Gabapentin) .... Sig: Take 3 Caps By Mouth Three Times Daily On The $10 For 90-Day List 6)  Pravastatin Sodium 40 Mg Tabs (Pravastatin Sodium) .Marland Kitchen.. 1 By Mouth Once Daily 7)  Levothyroxine Sodium 175 Mcg Tabs (Levothyroxine Sodium) .Marland Kitchen.. 1 By Mouth Once Daily  Allergies (verified): No Known Drug Allergies  Social History: Reviewed history from 02/01/2009 and no changes required. Not working. 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 Never Smoked Alcohol use-no Drug use-no Married, lives with wife, daughter, son, and step-daughter. Wife Newton ([place] [address]10), step-daughter Trudie Reed (YOB 1996), son Shar'Dayrias Osentoski (YOB 2002), daughter Ky Rumple [phone] 2003).  Physical Exam  General:  well appearing, no apparent distress Neck:  neck supple.  No thyroid nodules noted.  Lungs:  Normal respiratory effort, chest expands symmetrically. Lungs are clear to auscultation, no crackles or wheezes. Heart:  Normal rate and regular rhythm. S1 and S2 normal without gallop, murmur, click, rub or other extra sounds. Pulses:  palpable dp pulses bilaterally Extremities:  Mild erythema over the L 5th metacarpal head.  No erosion or skin breakdown noted.  Is mildly tender.   Monofilament testing reveals sensitive in all areas of both feet.  No skin maceration between toes. Brisk cap refill. No edema.    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  For another A1C today.  His updated medication list for this problem includes:    Metformin Hcl 1000 Mg Tabs (Metformin hcl) .Marland Kitchen... 1 by mouth two times daily  Glipizide 10 Mg Tabs (Glipizide) ..... Sig: take 1 tab by mouth two times a day    Lisinopril 10 Mg Tabs (Lisinopril) .Marland Kitchen... 1 by mouth once daily  Orders: [facility]- Est  Level 4 (55732)  Problem # 2:  HYPERLIPIDEMIA (ICD-272.4) For direct LDL today.  His updated medication list for this problem includes:    Pravastatin Sodium 40 Mg Tabs (Pravastatin sodium) .Marland Kitchen... 1 by mouth once daily  Orders: Direct LDL-[facility] ([address]-27062) [facility]- Est  Level 4 (37628)  Problem # 3:  HYPOTHYROIDISM (ICD-244.9) Previously high TSH; then went low.  Adjusted LT4 about 2 months ago and will recheck TSH today. He insists he has been taking the  LT4 daily as directed. His updated medication list for this problem includes:    Levothyroxine Sodium 175 Mcg Tabs (Levothyroxine sodium) .Marland Kitchen... 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, told he does not have glaucoma.  requesting records.  Problem # 5:  PERIPHERAL NEUROPATHY (ICD-356.9) Reports pain in the L foot along the 5th metatarsal head.  Mild erythema and tenderness, no findings that look like active foot ulceration.  For DM shoes, continue gabapentin as he is taking it.  Follow up in 2-3 months or sooner as needed. Handwritten Rx given for DME./shoes.  Complete Medication List: 1)  Metformin Hcl 1000 Mg Tabs (Metformin hcl) .Marland Kitchen.. 1 by mouth two times daily 2)  Glipizide 10 Mg Tabs (Glipizide) .... Sig: take 1 tab by mouth two times a day 3)  Lisinopril 10 Mg Tabs (Lisinopril) .Marland Kitchen.. 1 by mouth once daily 4)  Tramadol Hcl 50 Mg Tabs (Tramadol hcl) .... Sig take 1 tab by mouth every 6 to 12 hours as needed for pain 5)  Gabapentin 100 Mg Caps (Gabapentin) .... Sig: take 3 caps by mouth three times daily on the $10 for 90-day list 6)  Pravastatin Sodium 40 Mg Tabs (Pravastatin sodium) .Marland Kitchen.. 1 by mouth once daily 7)  Levothyroxine Sodium 175 Mcg Tabs (Levothyroxine sodium) .Marland Kitchen.. 1 by mouth once daily  Other Orders: A1C-[facility] (26948) Basic Met-[facility] ([id number])  Patient Instructions: 1)  It was a pleasure to see you today.  We are checking your LDL cholesterol, your thyroid test, and a metabolic panel today. 2)  I will order custom orthotics for your shoes, given your diabetes and the pain along your left foot.  I do not see anything to suggest you are getting a pressure ulcer at this time.  3)  I am glad you made it to the ophthalmologist and to hear you do not have glaucoma.  If you could ask them to send a record of your visit to me,I would  appreciate it.  4)  I would liketo see you back in another 2 t0 3 months.  Laboratory Results   Blood Tests   Date/Time Received: June 14, 2009 4:02 PM  Date/Time Reported: June 14, 2009 4:16 PM   HGBA1C: 7.4%   (Normal Range: Non-Diabetic - 3-6%   Control Diabetic - 6-8%)  Comments: ...............test performed by......Marland KitchenBonnie A. Swaz[place], MLS (ASCP)cm       Prevention & Chronic Care Immunizations   Influenza vaccine: Fluvax Non-MCR  (02/15/2009)    Tetanus booster: Not documented    Pneumococcal vaccine: Not documented  Other Screening   Smoking status: never  (06/14/2009)  Diabetes Mellitus   HgbA1C: 7.4  (06/14/2009)  Eye exam: difficult to appreciate  (02/01/2009)   Eye exam due: 02/2010    Foot exam: yes  (04/12/2009)   High risk foot: Not documented   Foot care education: Not documented    Urine microalbumin/creatinine ratio: Not documented    Diabetes flowsheet reviewed?: Yes   Progress toward A1C goal: At goal  Lipids   Total Cholesterol: 190  (02/04/2009)   LDL: 137  (02/04/2009)   LDL Direct: Not documented   HDL: 40  (02/04/2009)   Triglycerides: 66  (02/04/2009)    SGOT (AST): 17  (02/04/2009)   SGPT (ALT): 13  (02/04/2009)   Alkaline phosphatase: 59  (02/04/2009)   Total bilirubin: 0.5  (02/04/2009)    Lipid flowsheet reviewed?: Yes   Progress toward LDL goal: Unchanged  Hypertension   Last Blood Pressure: 103 / 70  (06/14/2009)   Serum creatinine: 1.19  (02/04/2009)   Serum potassium 4.6  (02/04/2009)    Hypertension flowsheet reviewed?: Yes   Progress toward BP goal: At goal  Self-Management Support :   Personal Goals (by the next clinic visit) :     Personal A1C goal: 8  (02/01/2009)     Personal blood pressure goal: 130/80  (02/01/2009)     Personal LDL goal: 100  (02/01/2009)    Diabetes self-management support: Written self-care plan  (02/01/2009)    Hypertension self-management support: Written self-care plan   (02/01/2009)    Lipid self-management support: Written self-care plan  (02/01/2009)

## 2010-02-11 NOTE — Progress Notes (Signed)
Summary: phn msg/called pt/FYI/TS  Phone Note Call from Patient Call back at Home Phone [phone]   Caller: Patient Summary of Call: Would like to talk to Dr. Mauricio Po about the letter he sent to his house. Initial call taken by: Clydell Hakim,  February 11, 2009 11:02 AM  Follow-up for Phone Call        pt did not take his cholesterol meds, did not have any. pt's [facility] [address]. i told the pt that i would send the rx to his pharmacy. fwd. to dr.breen for review. Follow-up by: Arlyss Repress CMA,,  February 11, 2009 12:12 PM

## 2010-02-11 NOTE — Progress Notes (Signed)
  Phone Note Call from Patient   Caller: Patient Call For: [phone] Summary of Call: Finished taking his Synthroid and will not be able to get new rx filled thru the [facility] Dept until the end of this month.  Pt will contact [facility] to see if they have meds to get for $4.00.  Will call back to let office know if the do and we can call it in. Initial call taken by: Abundio Miu,  November 26, 2009 4:17 PM  Follow-up for Phone Call        Pt called [facility] and they do have meds for 30 day supply at $4.00.  Please call info in for refill.  Let patient know when ready. Follow-up by: Abundio Miu,  November 26, 2009 4:22 PM  Additional Follow-up for Phone Call Additional follow up Details #1::        called pt lmvm to return call. need [facility]. Additional Follow-up by: Tessie Fass CMA,  November 27, 2009 9:12 AM    Additional Follow-up for Phone Call Additional follow up Details #2::    [facility] [address] Follow-up by: De Nurse,  November 28, 2009 1:55 PM  Additional Follow-up for Phone Call Additional follow up Details #3:: Details for Additional Follow-up Action Taken: called pt, informed of Rx at pharmacy Additional Follow-up by: Tessie Fass CMA,  November 29, 2009 3:50 PM  Prescriptions: LEVOTHYROXINE SODIUM 100 MCG TABS (LEVOTHYROXINE SODIUM) SIG Take 1 tablet one time daily  #30 x 3   Entered and Authorized by:   Paula Compton MD   Signed by:   Paula Compton MD on 11/28/2009   Method used:   Electronically to        [facility] (retail)       [address]       [place], [place]  [address]       Ph: [phone]       Fax: [phone]   RxID:   [id number]  I am glad to do it; which [facility] would he [facility] to? Paula Compton MD  November 27, 2009 7:03 AM  [facility]  November 28, 2009 1:56 PM  Prescription sent.   Paula Compton MD  November 28, 2009 5:04 PM

## 2010-02-11 NOTE — Progress Notes (Signed)
  Phone Note Outgoing Call   Call placed by: Paula Compton MD,  October 28, 2009 2:39 PM Call placed to: Patient Summary of Call: Called patient, who is doing well.  Reported results of TSH (low).  He reports that he is indeed taking LT4 daily; was taking previous dose ( ) once daily without missing days.  Feels well. Will change to LT4 one time daily, recheck TSH and A1C, BMet in first two weeks of December, visit at that time.  He voices agreement and understanding. Initial call taken by: Paula Compton MD,  October 28, 2009 2:40 PM    New/Updated Medications: LEVOTHYROXINE SODIUM 100 MCG TABS (LEVOTHYROXINE SODIUM) SIG Take 1 tablet one time daily Prescriptions: LEVOTHYROXINE SODIUM 100 MCG TABS (LEVOTHYROXINE SODIUM) SIG Take 1 tablet one time daily  #30 x 6   Entered and Authorized by:   Paula Compton MD   Signed by:   Paula Compton MD on 10/28/2009   Method used:   Faxed to ...       [facility] (retail)       [address] [place], [place]  [address]       Ph: [phone]       Fax: [phone]   RxID:   [id number]

## 2010-02-11 NOTE — Miscellaneous (Signed)
  Clinical Lists Changes  Medications: Changed medication from LEVOTHYROXINE SODIUM 75 MCG TABS (LEVOTHYROXINE SODIUM) taken one tablet daily to LEVOTHYROXINE SODIUM 75 MCG TABS (LEVOTHYROXINE SODIUM) SIG Take 2  tablets daily - Signed Rx of LEVOTHYROXINE SODIUM 75 MCG TABS (LEVOTHYROXINE SODIUM) SIG Take 2  tablets daily;  #60 x 6;  Signed;  Entered by: Paula Compton MD;  Authorized by: Paula Compton MD;  Method used: Electronically [facility]  [address] [id number] [address] [place], [place]  [address], Ph: [phone] or [phone], Fax: [phone] Orders: Added new Test order of TSH-[facility] [phone]) - Signed    Prescriptions: LEVOTHYROXINE SODIUM 75 MCG TABS (LEVOTHYROXINE SODIUM) SIG Take 2  tablets daily  #60 x 6   Entered and Authorized by:   Paula Compton MD   Signed by:   Paula Compton MD on 09/18/2009   Method used:   [facility] to        [facility]  [address] [id number] (retail[facility]       [address]       [place], [place]  [address]       Ph: [phone] or [phone]       Fax: [phone]   RxID:   [id number]

## 2010-02-11 NOTE — Assessment & Plan Note (Signed)
Summary: tooth pain,df   Vital Signs:  Patient profile:   45 year old male Weight:      169.1 pounds Temp:     98.5 degrees F oral Pulse rate:   78 / minute Pulse rhythm:   regular BP sitting:   115 / 76  (left arm) Cuff size:   regular  Vitals Entered By: Loralee Pacas CMA (December 04, 2009 11:03 AM) CC: tooth pain   CC:  tooth pain.  History of Present Illness: Ricardo Forbes comes in today for complaint of L lower dental pain that has been present for 1 month, getting much worse over the past 2 weeks.  Unbearable; took wife's vicodin with some relief.  Denies allergies, no valvular heart disease, no prior need for prophylaxis with dental procedures.  No fevers or chills. Has not been able to chew with L side of mouth for several weeks.  Some pain in L ear as well.  No changes in auditory acuity by report.     Habits & Providers  Alcohol-Tobacco-Diet     Tobacco Status: never  Current Medications (verified): 1)  Metformin Hcl 1000 Mg Tabs (Metformin Hcl) .Marland Kitchen.. 1 By Mouth Two Times Daily 2)  Glipizide 10 Mg Tabs (Glipizide) .... Sig: Take 1 Tab By Mouth Two Times A Day 3)  Lisinopril 10 Mg Tabs (Lisinopril) .Marland Kitchen.. 1 By Mouth Once Daily 4)  Tramadol Hcl 50 Mg Tabs (Tramadol Hcl) .... Sig Take 1 Tab By Mouth Every 6 To 12 Hours As Needed For Pain 5)  Gabapentin 100 Mg Caps (Gabapentin) .... Sig: Take 3 Caps By Mouth Three Times Daily On The $10 For 90-Day List 6)  Pravastatin Sodium 40 Mg Tabs (Pravastatin Sodium) .Marland Kitchen.. 1 By Mouth Once Daily 7)  Levothyroxine Sodium 100 Mcg Tabs (Levothyroxine Sodium) .... Sig Take 1 Tablet One Time Daily 8)  Amoxicillin-Pot Clavulanate 875-125 Mg Tabs (Amoxicillin-Pot Clavulanate) .Marland Kitchen.. 1 Tab By Mouth Every 12 Hours For 14 Days 9)  Vicodin 5-500 Mg Tabs (Hydrocodone-Acetaminophen) .Marland Kitchen.. 1 Tab By Mouth Every 6 Hours As Needed For Severe Dental Pain  Allergies (verified): No Known Drug Allergies  Physical Exam  General:  generally well appearing, no  apparent distress Head:  neck supple. no cervical adenopathy Ears:  TMs clear bilaterally, with good cones of light Mouth:  Visible gingival erythema around large abscess in L lower molars; very poor dentition generally.  Able to open mouth.  Clear oropharynx.  Moist mucus membranes Neck:  Neck supple.    Impression & Recommendations:  Problem # 1:  ABSCESS, TOOTH (ICD-522.5) Apparent active dental abscess, odontologic infection.  For emergency dental referral with [facility]. Referral made.  Starting on Augmentin 875/125 by mouth two times a day for 14 days, as well as pain meds (may use ibuprofen as needed, with Vicodin for severe pain).  He will be contacted by Dental once they receive our referral.  Orders: Dental Referral (Dentist) [facility]- Est Level  3 ([address])  Complete Medication List: 1)  Metformin Hcl 1000 Mg Tabs (Metformin hcl) .Marland Kitchen.. 1 by mouth two times daily 2)  Glipizide 10 Mg Tabs (Glipizide) .... Sig: take 1 tab by mouth two times a day 3)  Lisinopril 10 Mg Tabs (Lisinopril) .Marland Kitchen.. 1 by mouth once daily 4)  Tramadol Hcl 50 Mg Tabs (Tramadol hcl) .... Sig take 1 tab by mouth every 6 to 12 hours as needed for pain 5)  Gabapentin 100 Mg Caps (Gabapentin) .... Sig: take 3 caps by  mouth three times daily on the $10 for 90-day list 6)  Pravastatin Sodium 40 Mg Tabs (Pravastatin sodium) .Marland Kitchen.. 1 by mouth once daily 7)  Levothyroxine Sodium 100 Mcg Tabs (Levothyroxine sodium) .... Sig take 1 tablet one time daily 8)  Amoxicillin-pot Clavulanate 875-125 Mg Tabs (Amoxicillin-pot clavulanate) .Marland Kitchen.. 1 tab by mouth every 12 hours for 14 days 9)  Vicodin 5-500 Mg Tabs (Hydrocodone-acetaminophen) .Marland Kitchen.. 1 tab by mouth every 6 hours as needed for severe dental pain  Patient Instructions: 1)  It was a pleasure to see you today.  I am starting you on Augmentin 875/125 one tablet every 12 hours for 14 days, for the infection in your left lower molar.  2)  I faxed the prescription to  the [facility] MAP program today ([address] [place]).  I am giving you a paper copy in case you cannot get the medication today 3)  Also, a prescription for the Vicodin for use with extreme pain.  I am referring you to the dental clinic of [facility] for further evaluation of the tooth abscess.  Prescriptions: VICODIN 5-500 MG TABS (HYDROCODONE-ACETAMINOPHEN) 1 tab by mouth every 6 hours as needed for severe dental pain  #60 x 0   Entered and Authorized by:   Paula Compton MD   Signed by:   Paula Compton MD on 12/04/2009   Method used:   Print then Give to Patient   RxID:   [id number] AMOXICILLIN-POT CLAVULANATE 875-125 MG TABS (AMOXICILLIN-POT CLAVULANATE) 1 tab by mouth every 12 hours for 14 days  #28 x 0   Entered and Authorized by:   Paula Compton MD   Signed by:   Paula Compton MD on 12/04/2009   Method used:   Print then Give to Patient   RxID:   [id number] AMOXICILLIN-POT CLAVULANATE 875-125 MG TABS (AMOXICILLIN-POT CLAVULANATE) 1 tab by mouth every 12 hours for 14 days  #28 x 0   Entered and Authorized by:   Paula Compton MD   Signed by:   Paula Compton MD on 12/04/2009   Method used:   Faxed to ...       [facility] (retail)       [address] [place], [place]  [address]       Ph: [phone]       Fax: [phone]   RxID:   [id number]    Orders Added: 1)  Dental Referral [Dentist] 2)  [facility]- Est Level  3 [[address]]

## 2010-02-11 NOTE — Progress Notes (Signed)
  Phone Note Outgoing Call   Call placed by: Paula Compton MD,  April 15, 2009 1:48 PM Call placed to: Patient Action Taken: Phone Call Completed Summary of Call: Spoke with Ricardo Forbes about the low TSH level.  Plan to reduce dose to previous, stress importance of adhering to prescribing instructions, recheck TSH in 2 months.  He has concerns about erectile dysfunction which he forgot to mention at last visit.  I explain to him that his DM, neuropathy, HTN and thyroid disease all are likely playing a role in this problem.  Not to overlook targeted eval (discussion of morning erections; spousal communication issues and 'performance anxiety') at next office visit. Initial call taken by: Paula Compton MD,  April 15, 2009 1:58 PM    New/Updated Medications: LEVOTHYROXINE SODIUM 175 MCG TABS (LEVOTHYROXINE SODIUM) 1 by mouth once daily Prescriptions: LEVOTHYROXINE SODIUM 175 MCG TABS (LEVOTHYROXINE SODIUM) 1 by mouth once daily  #30 x 6   Entered and Authorized by:   Paula Compton MD   Signed by:   Paula Compton MD on 04/15/2009   Method used:   Electronically to        [facility] (retail)       [address]       [place], [place]  [address]       Ph: [phone]       Fax: [phone]   RxID:   [id number]

## 2010-02-11 NOTE — Letter (Signed)
Summary: Generic Letter  [facility]  [address]   [place], [place] [address]   Phone: [phone]  Fax: [phone]    09/18/2009  Ricardo Forbes [address] [place], [place]  [address]  Dear Mr. Bogdon,   I hope this letter finds you well.  I write with the results of your TSH (thyroid test).  The results show that you are not getting enough of the thyroid hormone that is in the medicine you are taking.  My records show you are taking per day of Levothyroxine (Levothyroid or Synthroid).  If you are taking the medicine daily as directed, then I believe we need to go up on your dose.  I would like you to take 2 tablets ( ) one time daily, and recheck the TSH lab test in another six to eight weeks (mid- to late-October).  If you have not been taking the medicine as regularly, then I ask that you stay with the same dose of one time daily, and recheck the lab in 6 to 8 weeks.  I am putting in an order for the next lab test in our lab at [facility], as well as a new prescription for the Levothyroid , take 2 tablets daily.        Sincerely,   Paula Compton MD

## 2010-02-13 NOTE — Letter (Signed)
Summary: Generic Letter  [facility]  [address]   [place], [place] [address]   Phone: [phone]  Fax: [phone]    12/30/2009  Ricardo Forbes [address] [place], [place]  [address]  Dear Mr. Armstrong,   I hope this letter finds you well.  I write with excellent news regarding your thyroid testing, done last Friday at the [facility].  The TSH test is normal, which means your thyroid medication is at the appropriate dosing for you.   Please continue to take the levothyroxine at the current dosing regimen.  We will recheck this in 1 year, or sooner if symptoms warrant.   Please feel free to call with any questions.     Sincerely,   Paula Compton MD  Appended Document: Generic Letter mailed

## 2010-02-13 NOTE — Assessment & Plan Note (Signed)
Summary: flu vaccine  Nurse Visit   Vital Signs:  Patient profile:   45 year old male Temp:     98.1 degrees F  Vitals Entered By: Theresia Lo RN (December 27, 2009 9:56 AM)  Allergies: No Known Drug Allergies  Immunizations Administered:  Influenza Vaccine # 1:    Vaccine Type: Fluvax 3+    Site: right deltoid    Mfr: [employer]    Dose: 0.5 ml    Route: IM    Given by: Theresia Lo RN    Exp. Date: 07/12/2010    Lot #: [id number]    VIS given: 08/06/09 version given December 27, 2009.  Flu Vaccine Consent Questions:    Do you have a history of severe allergic reactions to this vaccine? no    Any prior history of allergic reactions to egg and/or gelatin? no    Do you have a sensitivity to the preservative Thimersol? no    Do you have a past history of Guillan-Barre Syndrome? no    Do you currently have an acute febrile illness? no    Have you ever had a severe reaction to latex? no    Vaccine information given and explained to patient? yes  Orders Added: 1)  Flu Vaccine 68yrs + [[address]] 2)  Admin 1st Vaccine [[address]562]

## 2010-03-04 ENCOUNTER — Telehealth: Payer: Self-pay | Admitting: Family Medicine

## 2010-03-04 MED ORDER — LEVOTHYROXINE SODIUM 100 MCG PO TABS: 100.0000 ug | ORAL_TABLET | Freq: Every day | ORAL | Status: DC

## 2010-03-04 NOTE — Telephone Encounter (Signed)
Needs enough of his thyroid meds to last until the MAP program can get it in.  [facility] [address]

## 2010-03-04 NOTE — Telephone Encounter (Signed)
Prescription sent to [facility].  Please let him know.  Thanks JB

## 2010-03-17 IMAGING — CR DG CHEST 2V
2 series · 2 of 2 positions shown · non-contrast
Comparison: None

CLINICAL DATA: Persistent dry cough, nausea, vomiting, diarrhea

CHEST - 2 VIEW

[w chest pa]
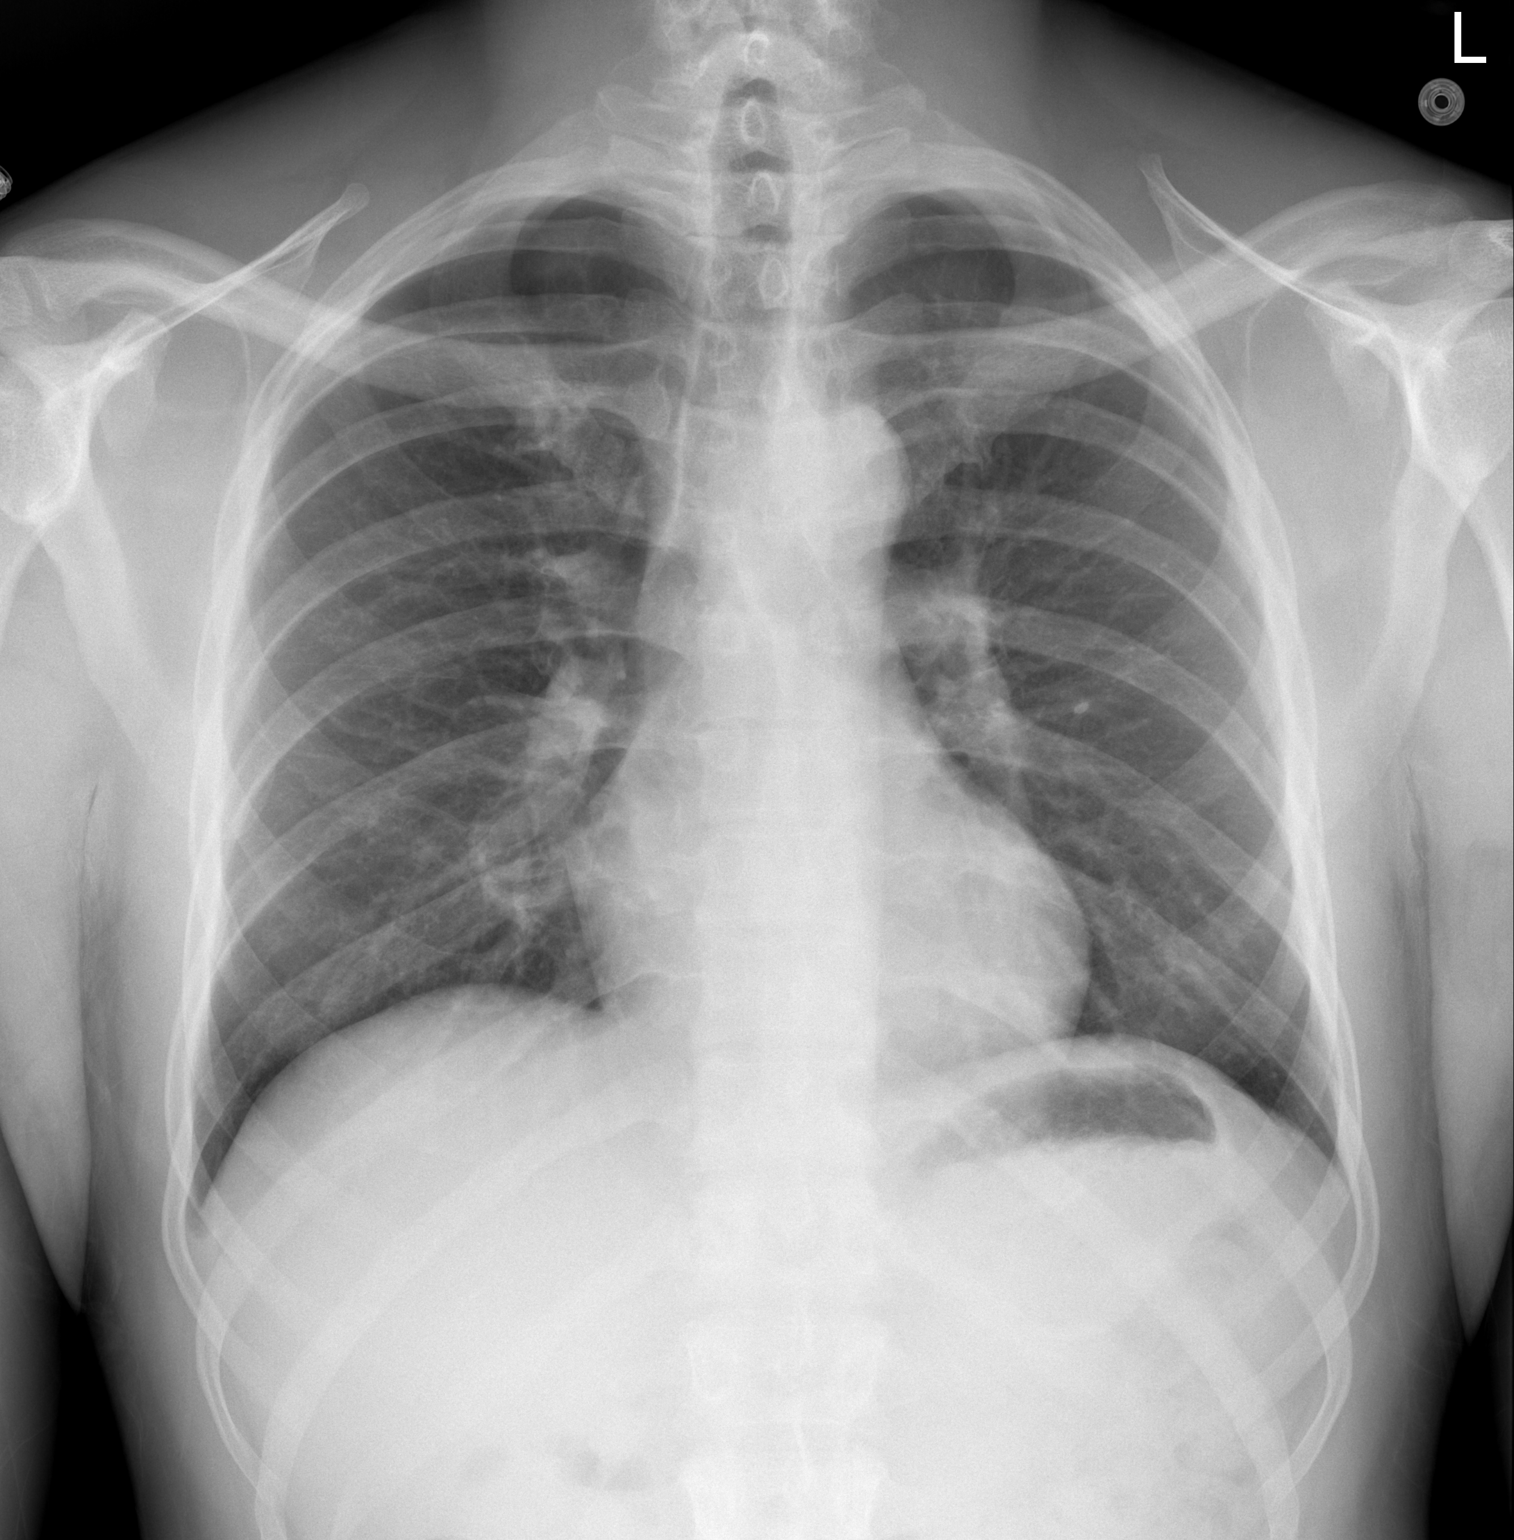

[w chest lat]
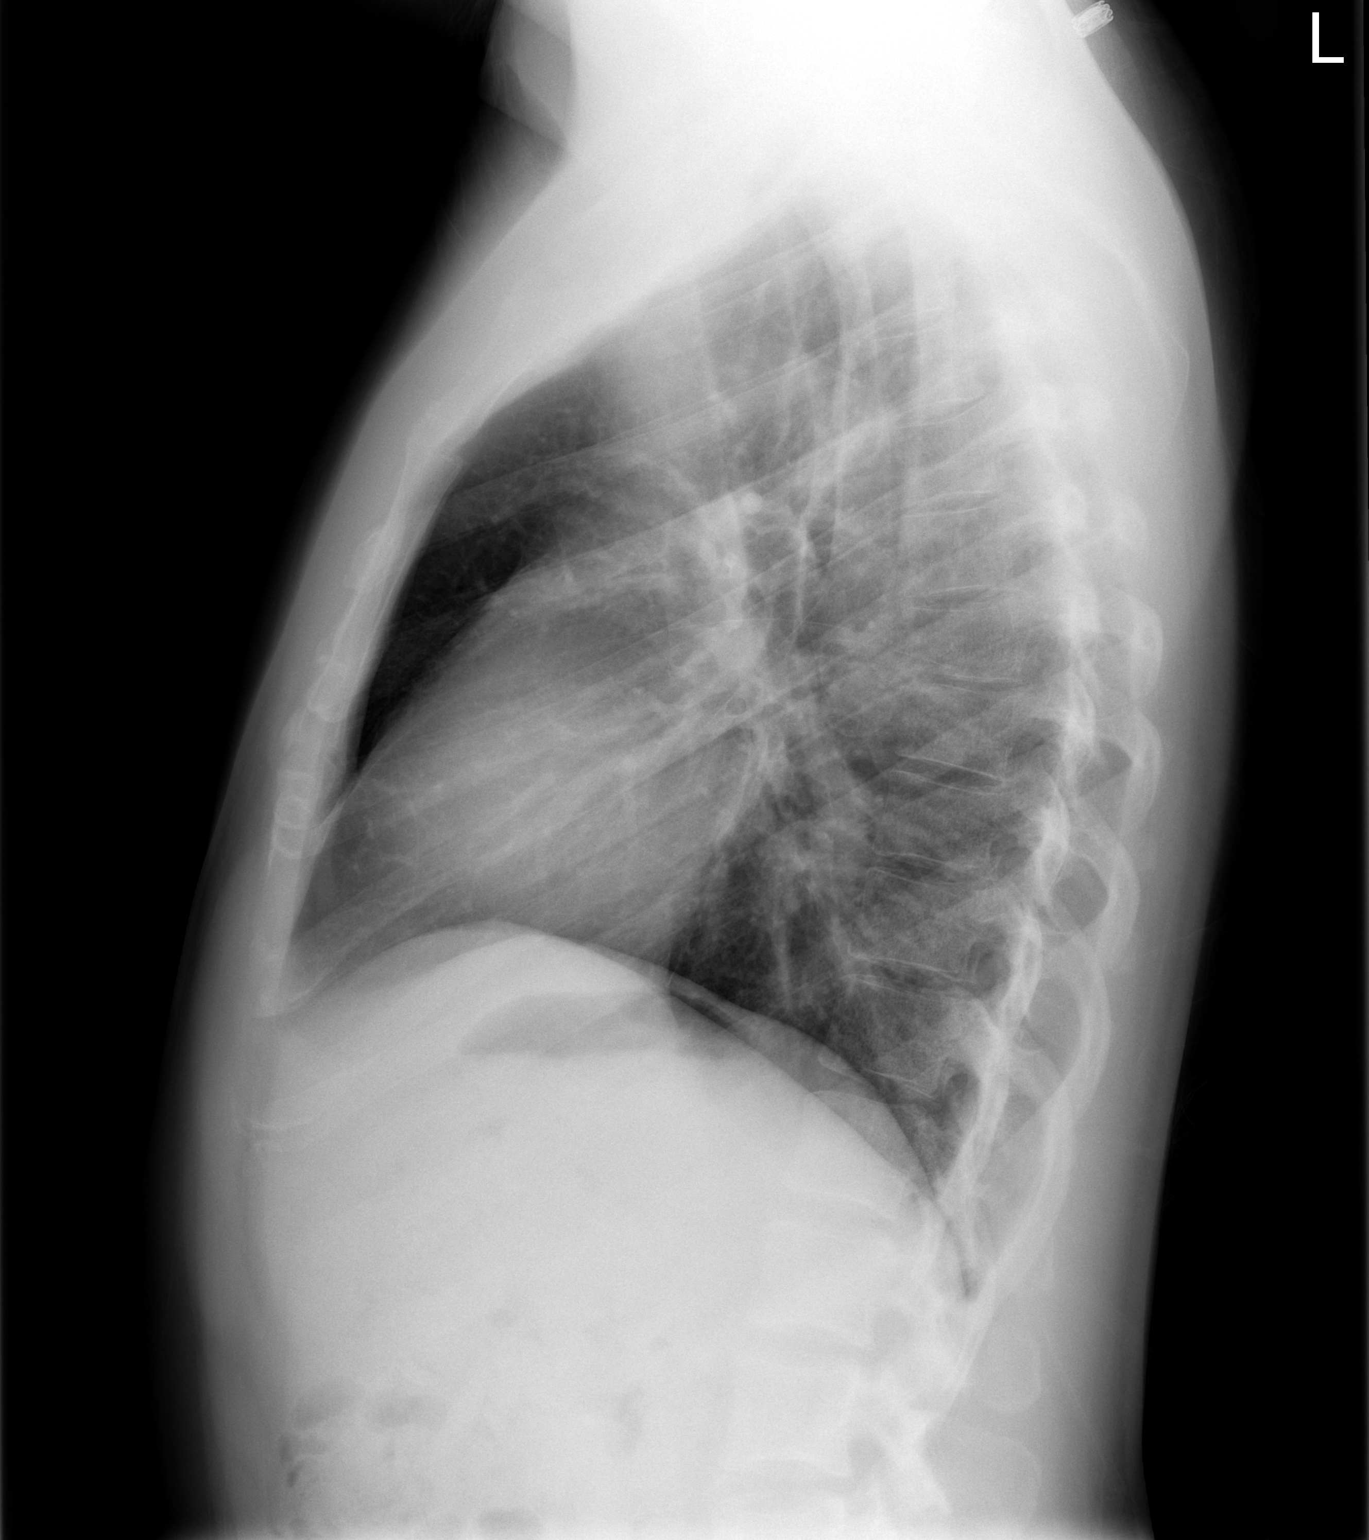

[2 of 2 positions shown; findings below may reference images not displayed]

FINDINGS: Normal heart size, mediastinal contours, and pulmonary vascularity.
Minimal peribronchial thickening.
Lungs otherwise clear.
No pleural effusion or pneumothorax.
Bones unremarkable.
IMPRESSION: Minimal chronic bronchitic changes without acute infiltrate.

## 2010-04-16 LAB — BASIC METABOLIC PANEL WITH GFR
BUN: 13 mg/dL (ref 6–23)
Calcium: 9.2 mg/dL (ref 8.4–10.5)
Creatinine, Ser: 1 mg/dL (ref 0.4–1.5)
GFR calc non Af Amer: >60 mL/min (ref >60)
Potassium: 4 meq/L (ref 3.5–5.1)

## 2010-04-16 LAB — BASIC METABOLIC PANEL
CO2: 29 mEq/L (ref 19–32)
Chloride: 101 mEq/L (ref 96–112)
GFR calc Af Amer: >60 mL/min (ref >60)
Glucose, Bld: 141 mg/dL — ABNORMAL HIGH (ref 70–99)
Sodium: 141 mEq/L (ref 135–145)

## 2010-04-19 LAB — BASIC METABOLIC PANEL
Chloride: 98 mEq/L (ref 96–112)
GFR calc Af Amer: >60 mL/min (ref >60)
Potassium: 4.2 mEq/L (ref 3.5–5.1)
Sodium: 140 mEq/L (ref 135–145)

## 2010-04-19 LAB — GLUCOSE, RANDOM: Glucose, Bld: 407 mg/dL — ABNORMAL HIGH (ref 70–99)

## 2010-04-19 LAB — GLUCOSE, CAPILLARY: Glucose-Capillary: 310 mg/dL — ABNORMAL HIGH (ref 70–99)

## 2010-04-30 ENCOUNTER — Ambulatory Visit (INDEPENDENT_AMBULATORY_CARE_PROVIDER_SITE_OTHER): Payer: Medicaid Other | Admitting: Family Medicine

## 2010-04-30 ENCOUNTER — Encounter: Payer: Self-pay | Admitting: Family Medicine

## 2010-04-30 VITALS — BP 132/91 | HR 81 | Temp 98.4°F | Ht 70.0 in | Wt 164.0 lb

## 2010-04-30 DIAGNOSIS — E119 Type 2 diabetes mellitus without complications: Secondary | ICD-10-CM

## 2010-04-30 DIAGNOSIS — E1149 Type 2 diabetes mellitus with other diabetic neurological complication: Secondary | ICD-10-CM

## 2010-04-30 DIAGNOSIS — E785 Hyperlipidemia, unspecified: Secondary | ICD-10-CM

## 2010-04-30 LAB — BASIC METABOLIC PANEL
Chloride: 103 mEq/L (ref 96–112)
Creat: 1.12 mg/dL (ref 0.40–1.50)
Potassium: 4.1 mEq/L (ref 3.5–5.3)
Sodium: 140 mEq/L (ref 135–145)

## 2010-04-30 LAB — POCT GLYCOSYLATED HEMOGLOBIN (HGB A1C): Hemoglobin A1C: 7.9

## 2010-04-30 MED ORDER — PRAVASTATIN SODIUM 40 MG PO TABS: 40.0000 mg | ORAL_TABLET | Freq: Every day | ORAL | Status: DC

## 2010-04-30 MED ORDER — METFORMIN HCL 1000 MG PO TABS: 1000.0000 mg | ORAL_TABLET | Freq: Two times a day (BID) | ORAL | Status: DC

## 2010-04-30 MED ORDER — LISINOPRIL 10 MG PO TABS: 10.0000 mg | ORAL_TABLET | Freq: Every day | ORAL | Status: DC

## 2010-04-30 MED ORDER — GABAPENTIN 300 MG PO CAPS: 300.0000 mg | ORAL_CAPSULE | Freq: Three times a day (TID) | ORAL | Status: DC

## 2010-04-30 MED ORDER — GLIPIZIDE 10 MG PO TABS: 10.0000 mg | ORAL_TABLET | Freq: Two times a day (BID) | ORAL | Status: DC

## 2010-04-30 NOTE — Assessment & Plan Note (Signed)
To continue statin; to check LDL today.  He has eaten today.  May do fasting Cholesterol panel at next visit in 4 to 6 months.

## 2010-04-30 NOTE — Progress Notes (Signed)
  Subjective:    Patient ID: Ricardo Forbes, male    DOB: Nov 16, 1965, 45 y.o.   MRN: [id number]  HPI Doing well; here for follow up of DM. 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  Emotionally feels well.  Sister-in-law moved down from [place], he gets along well with her and she is helping out in the home a lot.   Pain in feet from neuropathy is much improved with neurontin.  Not using vicodin or tramadol anymore.  Occasional twinges of pain when the weather changes.    Review of Systems  [facility] Summer 2011 for dilated exam; told he did not have glaucoma or DM retinal disease.  Expects to go again in Summer 2012.  No chest pain, no fevers/chills.  No foot pain.      Objective:   Physical Exam  Constitutional: He appears well-developed and well-nourished. No distress.  HENT:  Head: Normocephalic.  Mouth/Throat: No oropharyngeal exudate.  Eyes: Conjunctivae are normal. Pupils are equal, round, and reactive to light. No scleral icterus.  Neck: Normal range of motion. Neck supple. No thyromegaly present.  Cardiovascular: Normal rate, regular rhythm and normal heart sounds.  Exam reveals no gallop and no friction rub.   No murmur heard. Pulmonary/Chest: Effort normal and breath sounds normal. No respiratory distress. He has no wheezes. He has no rales.  Lymphadenopathy:    He has no cervical adenopathy.  Neurological:       No foot lesions; intact sensation bilat feet with monofilament testing.  No edema in ankles.  Palpable dp pulses.           Assessment & Plan:

## 2010-04-30 NOTE — Patient Instructions (Signed)
It was a pleasure to see you today.   We are checking your Hemoglobin A1C and LDL cholesterol, also your kidney functions and electrolytes.  I will contact you with the results of the labs.   I recommend you keep your yearly dilated-eye-exam with [facility] this summer.   I would like to see you back in 4-6 months.

## 2010-04-30 NOTE — Assessment & Plan Note (Signed)
A1C 7.9% today; taking max dose metformin, sulfonylurea.  Physical activity and diet, recheck in 4 months.  If above 8%, then to consider changes to meds.   Neuropathy much improved on current dose neurontin.

## 2010-05-02 ENCOUNTER — Telehealth: Payer: Self-pay | Admitting: Family Medicine

## 2010-05-02 DIAGNOSIS — E785 Hyperlipidemia, unspecified: Secondary | ICD-10-CM

## 2010-05-02 MED ORDER — ROSUVASTATIN CALCIUM 10 MG PO TABS: 10.0000 mg | ORAL_TABLET | Freq: Every day | ORAL | Status: DC

## 2010-05-02 NOTE — Telephone Encounter (Signed)
Talked with Ricardo Forbes about the rise in the LDL cholesterol.  Has been on Lipitor in the past and had itching.  Will change to Crestor 10mg  nightly, will recheck LDL and CMet in 8-10 weeks, he is to call before coming in to make lab appt.  I will contact him with lab results.

## 2010-05-30 ENCOUNTER — Telehealth: Payer: Self-pay | Admitting: Family Medicine

## 2010-05-30 ENCOUNTER — Ambulatory Visit (INDEPENDENT_AMBULATORY_CARE_PROVIDER_SITE_OTHER): Payer: Medicaid Other | Admitting: Family Medicine

## 2010-05-30 ENCOUNTER — Encounter: Payer: Self-pay | Admitting: Family Medicine

## 2010-05-30 VITALS — BP 116/77 | HR 60 | Temp 98.3°F | Ht 70.0 in | Wt 164.0 lb

## 2010-05-30 DIAGNOSIS — E1169 Type 2 diabetes mellitus with other specified complication: Secondary | ICD-10-CM | POA: Insufficient documentation

## 2010-05-30 DIAGNOSIS — N529 Male erectile dysfunction, unspecified: Secondary | ICD-10-CM | POA: Insufficient documentation

## 2010-05-30 DIAGNOSIS — N521 Erectile dysfunction due to diseases classified elsewhere: Secondary | ICD-10-CM

## 2010-05-30 DIAGNOSIS — E119 Type 2 diabetes mellitus without complications: Secondary | ICD-10-CM

## 2010-05-30 MED ORDER — SILDENAFIL CITRATE 100 MG PO TABS: 100.0000 mg | ORAL_TABLET | ORAL | Status: DC | PRN

## 2010-05-30 MED ORDER — VARDENAFIL HCL 5 MG PO TABS: 5.0000 mg | ORAL_TABLET | ORAL | Status: DC | PRN

## 2010-05-30 MED ORDER — GLIPIZIDE 10 MG PO TABS: ORAL_TABLET | ORAL | Status: DC

## 2010-05-30 NOTE — Telephone Encounter (Signed)
Called and left message for patient to return call. Please tell him that Rx was changed and sent to pharmacy, see message below.Laticha Ferrucci, Rodena Medin

## 2010-05-30 NOTE — Telephone Encounter (Signed)
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

## 2010-05-30 NOTE — Assessment & Plan Note (Signed)
Patient's A1C had increased at lat check.  Will increase dose of sulfonylurea and encourage him to continue his physical activity.  To recheck A1C again in another 3-4 months.

## 2010-05-30 NOTE — Telephone Encounter (Signed)
Forward to [place]. Patient wants to switch meds because of cost.Jacorian Golaszewski, Rodena Medin

## 2010-05-30 NOTE — Progress Notes (Signed)
  Subjective:    Patient ID: Ricardo Forbes, male    DOB: [birth year], 45 y.o.   MRN: [id number]  HPI Ricardo Forbes comes in today to discuss erectile dysfunction.  Has had difficulty getting an erection for the past 1 year.  Does not have spontaneous morning erections.  Has good libido.  Notes that he and family have been under a lot of stress, wife had surgical complications and remained hospitalized at [facility] and continues there presently (since April 23rd).  His sister-in-law is living in home to help with support for wife.    Ricardo Forbes denies any chest pain or shortness of breath.  He has never been a smoker. Exercises frequently (plays basketball, mows yard with push mower).    Review of Systems  Resolution of neuropathic foot pain.  No polyuria or difficulty with voiding, no dysuria.      Objective:   Physical Exam Well appearing, no apparent distress.         Assessment & Plan:

## 2010-05-30 NOTE — Telephone Encounter (Signed)
Please let patient know that I am sending a prescription for Levitra to his [facility] pharmacy.

## 2010-05-30 NOTE — Progress Notes (Signed)
Addended by: Mauricio Po MD, Lestine Mount on: 05/30/2010 04:14 PM   Modules accepted: Orders

## 2010-05-30 NOTE — Assessment & Plan Note (Signed)
Patient with difficulty achieving erection for over 1 year. History of DM neuropathic foot pain.  Denies chest pain, does not use nitrates.  Will give a trial of viagra to see if this helps.  Discussed psychological impact of ED as well.

## 2010-05-30 NOTE — Patient Instructions (Signed)
It was a pleasure to see you today.  I sent the prescriptions to [facility] on [address].   I am changing the glipizide 10mg  to 2 tablets in the morning and 1 tablet in the evening.  Please let me know if you have sweats or dizziness, which could mean your sugars are going too low.   I would like to see you back at the end of July or early August.

## 2010-06-02 NOTE — Telephone Encounter (Signed)
Pt returned call, informed of message but pt is checking with medicaid about a different rx & will call back if he wants that one instead.

## 2010-06-17 ENCOUNTER — Other Ambulatory Visit: Payer: Self-pay | Admitting: Family Medicine

## 2010-06-17 MED ORDER — GABAPENTIN 300 MG PO CAPS: 300.0000 mg | ORAL_CAPSULE | Freq: Three times a day (TID) | ORAL | Status: DC

## 2010-06-17 MED ORDER — ROSUVASTATIN CALCIUM 10 MG PO TABS: 10.0000 mg | ORAL_TABLET | Freq: Every day | ORAL | Status: DC

## 2010-06-18 ENCOUNTER — Telehealth: Payer: Self-pay | Admitting: Family Medicine

## 2010-06-18 MED ORDER — SIMVASTATIN 80 MG PO TABS: 80.0000 mg | ORAL_TABLET | Freq: Every evening | ORAL | Status: DC

## 2010-06-18 NOTE — Telephone Encounter (Signed)
Called patient regarding prior authorization for Crestor.  He has never been on simvastatin.  Will try simvastatin for 2 months, recehck LDL and decide on whether Crestor is warranted. He agrees with plan.

## 2010-10-14 ENCOUNTER — Encounter: Payer: Self-pay | Admitting: Family Medicine

## 2010-10-14 ENCOUNTER — Ambulatory Visit (INDEPENDENT_AMBULATORY_CARE_PROVIDER_SITE_OTHER): Payer: Medicaid Other | Admitting: Family Medicine

## 2010-10-14 VITALS — BP 128/80 | HR 67 | Ht 70.0 in | Wt 162.1 lb

## 2010-10-14 DIAGNOSIS — Z021 Encounter for pre-employment examination: Secondary | ICD-10-CM

## 2010-10-14 DIAGNOSIS — Z0289 Encounter for other administrative examinations: Secondary | ICD-10-CM

## 2010-10-14 DIAGNOSIS — E119 Type 2 diabetes mellitus without complications: Secondary | ICD-10-CM

## 2010-10-14 DIAGNOSIS — Z23 Encounter for immunization: Secondary | ICD-10-CM

## 2010-10-14 LAB — POCT URINALYSIS DIPSTICK
Bilirubin, UA: NEGATIVE
Glucose, UA: >=1000
Ketones, UA: 15
Spec Grav, UA: 1.015

## 2010-10-14 NOTE — Progress Notes (Signed)
  Subjective:    Patient ID: Ricardo Forbes, male    DOB: [birth year], 45 y.o.   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  No complaints.  Only conditions on DOT form which he has are DM andHTN.     Review of Systems  Denies chest pain, dyspnea, denies drug use, no penile discharge.  Never been hospitalized.  Form completed to reflect this.      Objective:   Physical Exam  Constitutional: He is oriented to person, place, and time. He appears well-developed and well-nourished. No distress.  HENT:  Head: Normocephalic.  Right Ear: External ear normal.  Left Ear: External ear normal.  Mouth/Throat: No oropharyngeal exudate.  Eyes: Conjunctivae and EOM are normal. Pupils are equal, round, and reactive to light. Right eye exhibits no discharge. No scleral icterus.  Neck: Neck supple. No JVD present. No thyromegaly present.  Cardiovascular: Normal rate, regular rhythm, normal heart sounds and intact distal pulses.  Exam reveals no gallop and no friction rub.   No murmur heard. Pulmonary/Chest: Effort normal and breath sounds normal. No respiratory distress. He has no wheezes. He has no rales.  Abdominal: Soft. Bowel sounds are normal. He exhibits no distension and no mass. There is no tenderness.  Genitourinary: Penis normal. No penile tenderness.  Musculoskeletal: Normal range of motion. He exhibits no edema and no tenderness.  Lymphadenopathy:    He has no cervical adenopathy.  Neurological: He is alert and oriented to person, place, and time. He has normal reflexes.  Skin: Skin is warm and dry. He is not diaphoretic.  Psychiatric: He has a normal mood and affect. His behavior is normal. Judgment and thought content normal.          Assessment & Plan:

## 2010-10-14 NOTE — Assessment & Plan Note (Signed)
A1C to be checked today.  DOT form completed, with exception of UDS form and signature. Akul agrees to UDS for purposes of the form.

## 2011-03-14 ENCOUNTER — Other Ambulatory Visit: Payer: Self-pay | Admitting: Family Medicine

## 2011-03-14 DIAGNOSIS — E039 Hypothyroidism, unspecified: Secondary | ICD-10-CM

## 2011-03-15 NOTE — Telephone Encounter (Signed)
Refill request

## 2011-03-16 NOTE — Telephone Encounter (Signed)
Limited refill of LT4 for patient; his last TSH was done end-2011 (just over 1 year ago).  Needs repeat TSH for continued refills.  Paula Compton, M.D>

## 2011-04-22 ENCOUNTER — Telehealth: Payer: Self-pay | Admitting: Family Medicine

## 2011-04-22 ENCOUNTER — Encounter: Payer: Self-pay | Admitting: Family Medicine

## 2011-04-22 NOTE — Telephone Encounter (Signed)
error 

## 2011-04-22 NOTE — Telephone Encounter (Signed)
Needs a letter today stating what his health problems are - he needs to take this to school today.  Needs to be on our letterhead.

## 2011-10-13 ENCOUNTER — Ambulatory Visit: Payer: Medicaid Other | Admitting: Family Medicine

## 2012-07-21 ENCOUNTER — Other Ambulatory Visit: Payer: Self-pay

## 2012-12-17 ENCOUNTER — Emergency Department (HOSPITAL_BASED_OUTPATIENT_CLINIC_OR_DEPARTMENT_OTHER)
Admission: EM | Admit: 2012-12-17 | Discharge: 2012-12-17 | Disposition: A | Discharge status: Home / Self Care | Payer: Medicaid Other | Attending: Emergency Medicine | Admitting: Emergency Medicine

## 2012-12-17 ENCOUNTER — Encounter (HOSPITAL_BASED_OUTPATIENT_CLINIC_OR_DEPARTMENT_OTHER): Payer: Self-pay | Admitting: Emergency Medicine

## 2012-12-17 DIAGNOSIS — M25561 Pain in right knee: Secondary | ICD-10-CM

## 2012-12-17 DIAGNOSIS — Z79899 Other long term (current) drug therapy: Secondary | ICD-10-CM | POA: Insufficient documentation

## 2012-12-17 DIAGNOSIS — M25569 Pain in unspecified knee: Secondary | ICD-10-CM | POA: Insufficient documentation

## 2012-12-17 DIAGNOSIS — E119 Type 2 diabetes mellitus without complications: Secondary | ICD-10-CM | POA: Insufficient documentation

## 2012-12-17 DIAGNOSIS — G8929 Other chronic pain: Secondary | ICD-10-CM | POA: Insufficient documentation

## 2012-12-17 HISTORY — DX: Type 2 diabetes mellitus without complications: E11.9

## 2012-12-17 MED ORDER — MELOXICAM 15 MG PO TABS: 15.0000 mg | ORAL_TABLET | Freq: Every day | ORAL | Status: DC

## 2012-12-17 MED ORDER — HYDROCODONE-ACETAMINOPHEN 5-325 MG PO TABS: 2.0000 | ORAL_TABLET | ORAL | Status: DC | PRN

## 2012-12-17 NOTE — ED Provider Notes (Signed)
CSN: [id number]     Arrival date & time 12/17/12  1058 History   None    Chief Complaint  Patient presents with  . Knee Pain   (Consider location/radiation/quality/duration/timing/severity/associated sxs/prior Treatment) Patient is a 47 y.o. male presenting with knee pain. The history is provided by the patient.  Knee Pain Location:  Knee Time since incident:  1 week Injury: no   Knee location:  L knee and R knee (right more than left) Pain details:    Quality:  Aching   Radiates to:  Does not radiate   Severity:  Moderate   Onset quality:  Gradual   Timing:  Constant   Progression:  Worsening Chronicity:  Chronic Dislocation: no   Foreign body present:  No foreign bodies Relieved by:  Nothing Worsened by:  Activity and bearing weight Ineffective treatments:  NSAIDs Associated symptoms: decreased ROM and swelling    Ricardo Forbes is a 47 y.o. male who presents to the ED with bilateral knee pain that has been off and on for at least 4 months. He states that he played basketball in the past and was a truck driver and thinks they both put a lot of stress on his knees. For the past week the pain has gotten worse. He as taken ibuprofen 800 mg without relief.   Past Medical History  Diagnosis Date  . Diabetes mellitus without complication    History reviewed. No pertinent past surgical history. No family history on file. History  Substance Use Topics  . Smoking status: Never Smoker   . Smokeless tobacco: Never Used  . Alcohol Use: Not on file    Review of Systems Negative except as stated in HPI  Allergies  Review of patient's allergies indicates no known allergies.  Home Medications   Current Outpatient Rx  Name  Route  Sig  Dispense  Refill  . pravastatin (PRAVACHOL) 10 MG tablet   Oral   Take 10 mg by mouth daily.         Marland Kitchen EXPIRED: gabapentin (NEURONTIN) 300 MG capsule   Oral   Take 1 capsule (300 mg total) by mouth 3 (three) times daily.   90 capsule  6   . glipiZIDE (GLUCOTROL) 10 MG tablet      Take 2 tablets by mouth in the morning and 1 tablet in the evening, with meals   90 tablet   6   . levothyroxine (SYNTHROID, LEVOTHROID) 100 MCG tablet      TAKE ONE TABLET BY MOUTH EVERY DAY   30 tablet   3   . metFORMIN (GLUCOPHAGE) 1000 MG tablet   Oral   Take 1 tablet (1,000 mg total) by mouth 2 (two) times daily with a meal.   180 tablet   3   . EXPIRED: vardenafil (LEVITRA) 5 MG tablet   Oral   Take 1 tablet (5 mg total) by mouth as needed for erectile dysfunction.   20 tablet   1    BP 143/96  Pulse 79  Temp(Src) 98.6 F (37 C) (Oral)  Resp 18  Wt 170 lb (77.111 kg)  SpO2 98% Physical Exam  Nursing note and vitals reviewed. Constitutional: He is oriented to person, place, and time. He appears well-developed and well-nourished. No distress.  HENT:  Head: Normocephalic and atraumatic.  Eyes: EOM are normal.  Neck: Neck supple.  Cardiovascular: Normal rate.   Pulmonary/Chest: Effort normal.  Musculoskeletal:       Legs: Bilateral knee tenderness with palpation,  right >left. Pedal pulses equal, adequate circulation, good touch sensation. Patient able to ambulation but states is painful. Pain with straight leg raises. Pain with flexion and extension of the knees.   Neurological: He is alert and oriented to person, place, and time. He has normal strength and normal reflexes. No cranial nerve deficit or sensory deficit. Gait normal.  Skin: Skin is warm and dry.  Psychiatric: He has a normal mood and affect. His behavior is normal.    ED Course  Procedures  EKG Interpretation   None       MDM  47 y.o. male with bilateral knee pain. Will treat with NSAIDS and pain medication. He is stable for discharge without any immediate complications. He will follow up with his orthopedic doctor that told him he would need surgery on his knee. He remains neurovascularly intact. Discussed with the patient and all questioned  fully answered.    Medication List    TAKE these medications       HYDROcodone-acetaminophen 5-325 MG per tablet  Commonly known as:  NORCO/VICODIN  Take 2 tablets by mouth every 4 (four) hours as needed.     meloxicam 15 MG tablet  Commonly known as:  MOBIC  Take 1 tablet (15 mg total) by mouth daily.      ASK your doctor about these medications       gabapentin 300 MG capsule  Commonly known as:  NEURONTIN  Take 1 capsule (300 mg total) by mouth 3 (three) times daily.     glipiZIDE 10 MG tablet  Commonly known as:  GLUCOTROL  Take 2 tablets by mouth in the morning and 1 tablet in the evening, with meals     levothyroxine 100 MCG tablet  Commonly known as:  SYNTHROID, LEVOTHROID  TAKE ONE TABLET BY MOUTH EVERY DAY     metFORMIN 1000 MG tablet  Commonly known as:  GLUCOPHAGE  Take 1 tablet (1,000 mg total) by mouth 2 (two) times daily with a meal.     pravastatin 10 MG tablet  Commonly known as:  PRAVACHOL  Take 10 mg by mouth daily.     vardenafil 5 MG tablet  Commonly known as:  LEVITRA  Take 1 tablet (5 mg total) by mouth as needed for erectile dysfunction.           [facility] Orlene Och, [place] 12/20/12 [phone]

## 2012-12-17 NOTE — ED Notes (Signed)
Patient here with chronic knee pain that seems worse the past few days, right knee worse than left, no new injury. Pain worse with ambulation, taking ibuprofen with minimal relief

## 2012-12-21 NOTE — ED Provider Notes (Signed)
Medical screening examination/treatment/procedure(s) were performed by non-physician practitioner and as supervising physician I was immediately available for consultation/collaboration.  EKG Interpretation   None         Rolan Bucco, MD 12/21/12 [phone]

## 2015-01-04 ENCOUNTER — Emergency Department (HOSPITAL_BASED_OUTPATIENT_CLINIC_OR_DEPARTMENT_OTHER)
Admission: EM | Admit: 2015-01-04 | Discharge: 2015-01-05 | Disposition: A | Discharge status: Home / Self Care | Payer: PRIVATE HEALTH INSURANCE | Attending: Emergency Medicine | Admitting: Emergency Medicine

## 2015-01-04 ENCOUNTER — Encounter (HOSPITAL_BASED_OUTPATIENT_CLINIC_OR_DEPARTMENT_OTHER): Payer: Self-pay | Admitting: *Deleted

## 2015-01-04 DIAGNOSIS — R531 Weakness: Secondary | ICD-10-CM | POA: Insufficient documentation

## 2015-01-04 DIAGNOSIS — R5383 Other fatigue: Secondary | ICD-10-CM | POA: Diagnosis not present

## 2015-01-04 DIAGNOSIS — Z791 Long term (current) use of non-steroidal anti-inflammatories (NSAID): Secondary | ICD-10-CM | POA: Diagnosis not present

## 2015-01-04 DIAGNOSIS — E119 Type 2 diabetes mellitus without complications: Secondary | ICD-10-CM | POA: Diagnosis not present

## 2015-01-04 DIAGNOSIS — Z79899 Other long term (current) drug therapy: Secondary | ICD-10-CM | POA: Diagnosis not present

## 2015-01-04 DIAGNOSIS — Z8639 Personal history of other endocrine, nutritional and metabolic disease: Secondary | ICD-10-CM

## 2015-01-04 DIAGNOSIS — Z7984 Long term (current) use of oral hypoglycemic drugs: Secondary | ICD-10-CM | POA: Diagnosis not present

## 2015-01-04 LAB — URINALYSIS, ROUTINE W REFLEX MICROSCOPIC
Glucose, UA: NEGATIVE mg/dL
Ketones, ur: 15 mg/dL — AB
Leukocytes, UA: NEGATIVE
Nitrite: NEGATIVE
Protein, ur: 100 mg/dL — AB
Specific Gravity, Urine: 1.028 (ref 1.005–1.030)
pH: 6 (ref 5.0–8.0)

## 2015-01-04 LAB — URINE MICROSCOPIC-ADD ON

## 2015-01-04 LAB — CBG MONITORING, ED: Glucose-Capillary: 134 mg/dL — ABNORMAL HIGH (ref 65–99)

## 2015-01-04 NOTE — ED Notes (Addendum)
Pt c/o generalized weakness and fatigue x 12 hrs  Pt states he is out of his synthroid

## 2015-01-04 NOTE — ED Provider Notes (Addendum)
CSN: [id number]     Arrival date & time 01/04/15  2119 History  By signing my name below, I, Soijett Blue, attest that this documentation has been prepared under the direction and in the presence of Shon Baton, MD. Electronically Signed: Soijett Blue, ED Scribe. 01/04/2015. 12:03 AM.   Chief Complaint  Patient presents with  . Fatigue      The history is provided by the patient. No language interpreter was used.    HPI Comments: Ricardo Forbes is a 49 y.o. male with a medical hx of DM and hypothyroidism, who presents to the Emergency Department complaining of fatigue onset 12 hours. He notes that he is out of his synthroid medications x 2 months at this time. He states that he is unsure of the accurate dosage of his thyroid medication. He notes that he does not have a PCP anymore because he lost his insurance and they dropped him as a pt. He states that he is having associated symptoms of generalized weakness. He states that he has tried supplemental medications with mild relief for his symptoms. He denies fever, cough, congestion, weight gain, n/v/d, abdominal pain, HA and any other symptoms. He reports that his blood sugar today was 134 and that he will regularly check them.     Past Medical History  Diagnosis Date  . Diabetes mellitus without complication (HCC)    History reviewed. No pertinent past surgical history. History reviewed. No pertinent family history. Social History  Substance Use Topics  . Smoking status: Never Smoker   . Smokeless tobacco: Never Used  . Alcohol Use: No    Review of Systems  Constitutional: Positive for fatigue. Negative for fever and unexpected weight change.  Respiratory: Negative for chest tightness and shortness of breath.   Cardiovascular: Negative for chest pain.  Gastrointestinal: Negative for nausea, vomiting, abdominal pain and diarrhea.  Skin: Negative for rash.  Neurological: Positive for weakness.  All other systems reviewed  and are negative.    Allergies  Review of patient's allergies indicates no known allergies.  Home Medications   Prior to Admission medications   Medication Sig Start Date End Date Taking? Authorizing Provider  gabapentin (NEURONTIN) 300 MG capsule Take 1 capsule (300 mg total) by mouth 3 (three) times daily. 06/17/10 06/17/11  Barbaraann Barthel, MD  glipiZIDE (GLUCOTROL) 10 MG tablet Take 2 tablets by mouth in the morning and 1 tablet in the evening, with meals 05/30/10   Barbaraann Barthel, MD  HYDROcodone-acetaminophen (NORCO/VICODIN) 5-325 MG per tablet Take 2 tablets by mouth every 4 (four) hours as needed. 12/17/12   Hope Orlene Och, NP  levothyroxine (SYNTHROID, LEVOTHROID) 100 MCG tablet TAKE ONE TABLET BY MOUTH EVERY DAY 03/14/11   Barbaraann Barthel, MD  meloxicam (MOBIC) 15 MG tablet Take 1 tablet (15 mg total) by mouth daily. 12/17/12   Hope Orlene Och, NP  metFORMIN (GLUCOPHAGE) 1000 MG tablet Take 1 tablet (1,000 mg total) by mouth 2 (two) times daily with a meal. 04/30/10   Barbaraann Barthel, MD  pravastatin (PRAVACHOL) 10 MG tablet Take 10 mg by mouth daily.    Historical Provider, MD  vardenafil (LEVITRA) 5 MG tablet Take 1 tablet (5 mg total) by mouth as needed for erectile dysfunction. 05/30/10 05/30/11  Barbaraann Barthel, MD   BP 136/92 mmHg  Pulse 72  Temp(Src) 97.4 F (36.3 C) (Oral)  Resp 18  Ht  (1.778 m)  Wt 175 lb (79.379 kg)  BMI 25.11  kg/m2  SpO2 96% Physical Exam  Constitutional: He is oriented to person, place, and time. He appears well-developed and well-nourished.  HENT:  Head: Normocephalic and atraumatic.  Eyes: Pupils are equal, round, and reactive to light.  Cardiovascular: Normal rate, regular rhythm and normal heart sounds.   No murmur heard. Pulmonary/Chest: Effort normal and breath sounds normal. No respiratory distress. He has no wheezes.  Abdominal: Soft. Bowel sounds are normal. There is no tenderness. There is no rebound.  Musculoskeletal: He exhibits no edema.   Neurological: He is alert and oriented to person, place, and time.  Skin: Skin is warm and dry.  Psychiatric: He has a normal mood and affect.  Nursing note and vitals reviewed.   ED Course  Procedures (including critical care time) DIAGNOSTIC STUDIES: Oxygen Saturation is 96% on RA, nl by my interpretation.    COORDINATION OF CARE: 11:56 PM Discussed treatment plan with pt at bedside which includes labs and UA and pt agreed to plan.    Labs Review Labs Reviewed  URINALYSIS, ROUTINE W REFLEX MICROSCOPIC (NOT AT [facility]) - Abnormal; Notable for the following:    Color, Urine AMBER (*)    APPearance CLOUDY (*)    Hgb urine dipstick LARGE (*)    Bilirubin Urine SMALL (*)    Ketones, ur 15 (*)    Protein, ur 100 (*)    All other components within normal limits  URINE MICROSCOPIC-ADD ON - Abnormal; Notable for the following:    Squamous Epithelial / LPF 0-5 (*)    Bacteria, UA RARE (*)    All other components within normal limits  CBG MONITORING, ED - Abnormal; Notable for the following:    Glucose-Capillary 134 (*)    All other components within normal limits  TSH    Imaging Review No results found. I have personally reviewed and evaluated these lab results as part of my medical decision-making.   EKG Interpretation None      MDM   Final diagnoses:  Other fatigue  History of hypothyroidism    Patient presents with generalized fatigue. No other associated symptoms. Has been off Synthroid for 2 months. Unclear of his prior dosing. Nontoxic on exam and exam is completely benign. Blood glucose 134. No significant dehydration on urinalysis. Discussed with patient that given how long he has been off his thyroid medication, I would be hesitant to restart. He needs to have a primary physician follow a TSH. I will obtain a TSH and call the patient if markedly abnormal. He was given [facility] follow-up.  After history, exam, and medical workup I feel the patient has been  appropriately medically screened and is safe for discharge home. Pertinent diagnoses were discussed with the patient. Patient was given return precautions.  I personally performed the services described in this documentation, which was scribed in my presence. The recorded information has been reviewed and is accurate.    Shon Batonourtney F Horton, MD 01/05/15 0310  TSH 56.  Attempted to contact patient with listed phone number. Unable to contact. Will have case management contact patient with lab results and arrange for follow-up.  12:50 PM Discussed with SW this morning.  Referred to case management to contact patient and assist in follow-up.  Shon Batonourtney F Horton, MD 01/07/15 [phone]

## 2015-01-05 LAB — TSH: TSH: 56.612 u[IU]/mL — ABNORMAL HIGH (ref 0.350–4.500)

## 2015-01-05 NOTE — Discharge Instructions (Signed)
You were seen today for fatigue.  YOu have been out of your thyroid medication for months.  Your thyroid was checked and you will be called with the level.   Fatigue Fatigue is feeling tired all of the time, a lack of energy, or a lack of motivation. Occasional or mild fatigue is often a normal response to activity or life in general. However, long-lasting (chronic) or extreme fatigue may indicate an underlying medical condition. HOME CARE INSTRUCTIONS  Watch your fatigue for any changes. The following actions may help to lessen any discomfort you are feeling:  Talk to your health care provider about how much sleep you need each night. Try to get the required amount every night.  Take medicines only as directed by your health care provider.  Eat a healthy and nutritious diet. Ask your health care provider if you need help changing your diet.  Drink enough fluid to keep your urine clear or pale yellow.  Practice ways of relaxing, such as yoga, meditation, massage therapy, or acupuncture.  Exercise regularly.   Change situations that cause you stress. Try to keep your work and personal routine reasonable.  Do not abuse illegal drugs.  Limit alcohol intake to no more than 1 drink per day for nonpregnant women and 2 drinks per day for men. One drink equals 12 ounces of beer, 5 ounces of wine, or 1 ounces of hard liquor.  Take a multivitamin, if directed by your health care provider. SEEK MEDICAL CARE IF:   Your fatigue does not get better.  You have a fever.   You have unintentional weight loss or gain.  You have headaches.   You have difficulty:   Falling asleep.  Sleeping throughout the night.  You feel angry, guilty, anxious, or sad.   You are unable to have a bowel movement (constipation).   You skin is dry.   Your legs or another part of your body is swollen.  SEEK IMMEDIATE MEDICAL CARE IF:   You feel confused.   Your vision is blurry.  You feel  faint or pass out.   You have a severe headache.   You have severe abdominal, pelvic, or back pain.   You have chest pain, shortness of breath, or an irregular or fast heartbeat.   You are unable to urinate or you urinate less than normal.   You develop abnormal bleeding, such as bleeding from the rectum, vagina, nose, lungs, or nipples.  You vomit blood.   You have thoughts about harming yourself or committing suicide.   You are worried that you might harm someone else.    This information is not intended to replace advice given to you by your health care provider. Make sure you discuss any questions you have with your health care provider.   Document Released: 10/26/2006 Document Revised: 01/19/2014 Document Reviewed: 05/02/2013 Elsevier Interactive Patient Education [employer]2016 [employer]

## 2015-01-07 ENCOUNTER — Telehealth: Payer: Self-pay | Admitting: *Deleted

## 2015-01-11 ENCOUNTER — Encounter: Payer: Self-pay | Admitting: Internal Medicine

## 2015-01-11 ENCOUNTER — Telehealth: Payer: Self-pay | Admitting: Internal Medicine

## 2015-01-11 ENCOUNTER — Ambulatory Visit: Payer: PRIVATE HEALTH INSURANCE | Attending: Internal Medicine | Admitting: Internal Medicine

## 2015-01-11 VITALS — BP 136/89 | HR 71 | Temp 98.0°F | Resp 16 | Ht 70.0 in | Wt 181.4 lb

## 2015-01-11 DIAGNOSIS — Z7984 Long term (current) use of oral hypoglycemic drugs: Secondary | ICD-10-CM | POA: Insufficient documentation

## 2015-01-11 DIAGNOSIS — E119 Type 2 diabetes mellitus without complications: Secondary | ICD-10-CM | POA: Diagnosis present

## 2015-01-11 DIAGNOSIS — R5383 Other fatigue: Secondary | ICD-10-CM | POA: Insufficient documentation

## 2015-01-11 DIAGNOSIS — E079 Disorder of thyroid, unspecified: Secondary | ICD-10-CM | POA: Insufficient documentation

## 2015-01-11 DIAGNOSIS — E785 Hyperlipidemia, unspecified: Secondary | ICD-10-CM | POA: Insufficient documentation

## 2015-01-11 LAB — CBC WITH DIFFERENTIAL/PLATELET
Basophils Absolute: 0 10*3/uL (ref 0.0–0.1)
Basophils Relative: 1 % (ref 0–1)
Eosinophils Absolute: 0.1 10*3/uL (ref 0.0–0.7)
Eosinophils Relative: 4 % (ref 0–5)
HEMATOCRIT: 40.4 % (ref 39.0–52.0)
HEMOGLOBIN: 13.3 g/dL (ref 13.0–17.0)
Lymphocytes Relative: 32 % (ref 12–46)
Lymphs Abs: 1.2 10*3/uL (ref 0.7–4.0)
MCH: 30.5 pg (ref 26.0–34.0)
MCHC: 32.9 g/dL (ref 30.0–36.0)
MCV: 92.7 fL (ref 78.0–100.0)
MONO ABS: 0.2 10*3/uL (ref 0.1–1.0)
MONOS PCT: 5 % (ref 3–12)
MPV: 10.6 fL (ref 8.6–12.4)
NEUTROS ABS: 2.1 10*3/uL (ref 1.7–7.7)
Neutrophils Relative %: 58 % (ref 43–77)
Platelets: 263 10*3/uL (ref 150–400)
RBC: 4.36 MIL/uL (ref 4.22–5.81)
RDW: 14.5 % (ref 11.5–15.5)
WBC: 3.7 10*3/uL — ABNORMAL LOW (ref 4.0–10.5)

## 2015-01-11 LAB — BASIC METABOLIC PANEL
BUN: 9 mg/dL (ref 7–25)
CO2: 28 mmol/L (ref 20–31)
Calcium: 8.9 mg/dL (ref 8.6–10.3)
Chloride: 99 mmol/L (ref 98–110)
Creat: 1.03 mg/dL (ref 0.60–1.35)
GLUCOSE: 154 mg/dL — AB (ref 65–99)
Potassium: 4.7 mmol/L (ref 3.5–5.3)
Sodium: 137 mmol/L (ref 135–146)

## 2015-01-11 LAB — POCT GLYCOSYLATED HEMOGLOBIN (HGB A1C): Hemoglobin A1C: 7.9

## 2015-01-11 LAB — GLUCOSE, POCT (MANUAL RESULT ENTRY): POC Glucose: 94 mg/dl (ref 70–99)

## 2015-01-11 MED ORDER — INSULIN PEN NEEDLE 31G X 5 MM MISC: Status: DC

## 2015-01-11 MED ORDER — LEVOTHYROXINE SODIUM 100 MCG PO TABS: 100.0000 ug | ORAL_TABLET | Freq: Every day | ORAL | Status: DC

## 2015-01-11 MED ORDER — PRAVASTATIN SODIUM 20 MG PO TABS: 20.0000 mg | ORAL_TABLET | Freq: Every day | ORAL | Status: DC

## 2015-01-11 MED ORDER — INSULIN GLARGINE 100 UNIT/ML SOLOSTAR PEN: 35.0000 [IU] | PEN_INJECTOR | Freq: Every day | SUBCUTANEOUS | Status: DC

## 2015-01-11 MED ORDER — METFORMIN HCL ER 500 MG PO TB24: 1000.0000 mg | ORAL_TABLET | Freq: Two times a day (BID) | ORAL | Status: DC

## 2015-01-11 MED ORDER — GLIPIZIDE 10 MG PO TABS: 10.0000 mg | ORAL_TABLET | Freq: Two times a day (BID) | ORAL | Status: DC

## 2015-01-11 NOTE — Progress Notes (Signed)
Patient ID: Ricardo Forbes, male   DOB: 25-Jun-1965, 49 y.o.   MRN: [id number]  [id number]:[id number]  [id number]  DOB - Jan 02, 1966  CC:  Chief Complaint  Patient presents with  . Establish Care       HPI: Ricardo Forbes is a 49 y.o. male here today to establish medical care. Patient has a past medical history of Type 2 Diabetes and thyroid disease. Patient reports that he lost his insurance due to job changes and has been out of his Levothyroxine for 2 months. He states that he went to the ER last week for weakness and found that his TSH was elevated. He was not given a refill at that time but was given a referral here. During this time he has continued to take his Glipizide and Metformin. He reports that he uses Lantus 35 units as needed when he notices increases in his sugars. His A1C today is 7.9% which has increased per patient. Previously being seen by Dr. March Rummage at [facility]. 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 Would like a letter for short term disability until medication begins to improve his symptoms. Other symptoms include constipation and weight gain.  No Known Allergies Past Medical History  Diagnosis Date  . Diabetes mellitus without complication [facility])    Current Outpatient Prescriptions on File Prior to Visit  Medication Sig Dispense Refill  . gabapentin (NEURONTIN) 300 MG capsule Take 1 capsule (300 mg total) by mouth 3 (three) times daily. 90 capsule 6  . glipiZIDE (GLUCOTROL) 10 MG tablet Take 2 tablets by mouth in the morning and 1 tablet in the evening, with meals 90 tablet 6  . HYDROcodone-acetaminophen (NORCO/VICODIN) 5-325 MG per tablet Take 2 tablets by mouth every 4 (four) hours as needed. 10 tablet 0  . levothyroxine (SYNTHROID, LEVOTHROID) 100 MCG tablet TAKE ONE TABLET BY MOUTH EVERY DAY 30 tablet 3  . meloxicam (MOBIC) 15 MG tablet Take 1 tablet (15 mg total) by mouth  daily. 10 tablet 0  . metFORMIN (GLUCOPHAGE) 1000 MG tablet Take 1 tablet (1,000 mg total) by mouth 2 (two) times daily with a meal. 180 tablet 3  . pravastatin (PRAVACHOL) 10 MG tablet Take 10 mg by mouth daily.    . vardenafil (LEVITRA) 5 MG tablet Take 1 tablet (5 mg total) by mouth as needed for erectile dysfunction. 20 tablet 1   No current facility-administered medications on file prior to visit.   No family history on file. Social History   Social History  . Marital Status: Married    Spouse Name: N/A  . Number of Children: N/A  . Years of Education: N/A   Occupational History  . Not on file.   Social History Main Topics  . Smoking status: Never Smoker   . Smokeless tobacco: Never Used  . Alcohol Use: No  . Drug Use: Not on file  . Sexual Activity: Yes    Birth Control/ Protection: None   Other Topics Concern  . Not on file   Social History Narrative    Review of Systems: Other than what is stated in HPI, all other systems are negative.    Objective:   Filed Vitals:   01/11/15 0916  BP: 136/89  Pulse: 71  Temp: 98 F (36.7 C)  Resp: 16    Physical Exam: Constitutional: Patient appears well-developed and well-nourished. No distress. HENT: Normocephalic, atraumatic, External  right and left ear normal. Oropharynx is clear and moist.  Eyes: Conjunctivae and EOM are normal. PERRLA, no scleral icterus. Neck: Normal ROM. Neck supple. No JVD. No tracheal deviation. + thyromegaly. CVS: RRR, S1/S2 +, no murmurs, no gallops, no carotid bruit.  Pulmonary: Effort and breath sounds normal, no stridor, rhonchi, wheezes, rales.  Abdominal: Soft. BS +, no distension, tenderness, rebound or guarding.  Musculoskeletal: Normal range of motion. No edema and no tenderness.  Lymphadenopathy: No lymphadenopathy noted, cervical, inguinal or axillary Neuro: Alert. Normal reflexes, muscle tone coordination. No cranial nerve deficit. Skin: Skin is warm and dry. No rash noted.  Not diaphoretic. No erythema. No pallor. Psychiatric: Normal mood and affect. Behavior, judgment, thought content normal.  Lab Results  Component Value Date   WBC 5.1 02/04/2009   HGB 13.2 02/04/2009   HCT 41.2 02/04/2009   MCV 92.6 02/04/2009   PLT 296 02/04/2009   Lab Results  Component Value Date   CREATININE 1.12 04/30/2010   BUN 9 04/30/2010   NA 140 04/30/2010   K 4.1 04/30/2010   CL 103 04/30/2010   CO2 27 04/30/2010    Lab Results  Component Value Date   HGBA1C 7.90 01/11/2015   Lipid Panel     Component Value Date/Time   CHOL 190 02/04/2009 1818   TRIG 66 02/04/2009 1818   HDL 40 02/04/2009 1818   CHOLHDL 4.8 Ratio 02/04/2009 1818   VLDL 13 02/04/2009 1818   LDLCALC 137* 02/04/2009 1818       Assessment and plan:   Nycere was seen today for establish care.  Diagnoses and all orders for this visit:  Type 2 diabetes mellitus without complication, without long-term current use of insulin (HCC) -     Glucose (CBG) -     HgB A1c -     Flu Vaccine QUAD 36+ mos PF IM (Fluarix & Fluzone Quad PF) -     Microalbumin, urine -     metFORMIN (GLUCOPHAGE-XR) 500 MG 24 hr tablet; Take 2 tablets (1,000 mg total) by mouth 2 (two) times daily. -     glipiZIDE (GLUCOTROL) 10 MG tablet; Take 1 tablet (10 mg total) by mouth 2 (two) times daily before a meal. -     Insulin Glargine (LANTUS SOLOSTAR) 100 UNIT/ML Solostar Pen; Inject 35 Units into the skin daily at 10 pm. -     Basic Metabolic Panel -     Insulin Pen Needle 31G X 5 MM MISC; Give insulin once per night Patients A1C is 7.9% today, his goal is 7%. I have addressed strict diet recommendations and medication adherence.  Thyroid disease -     TSH; Future -     T4, Free; Future -     levothyroxine (SYNTHROID, LEVOTHROID) 100 MCG tablet; Take 1 tablet (100 mcg total) by mouth daily. I will start him back on 100 mcg and have him come back in 6 weeks for a repeat level. Changes will be made as needed.   Other  fatigue -     CBC with Differential Related to being off his Synthroid and the increase in his TSH level.   HLD (hyperlipidemia) -     pravastatin (PRAVACHOL) 20 MG tablet; Take 1 tablet (20 mg total) by mouth daily. -     Lipid panel; Future I will have him to come back fasting on next visit and I will recheck cholesterol at that time. Meds refilled today.   Return in about 6 weeks (  around 02/22/2015) for Lab Visit/ RN visit-BP check and 3 mo PCP.      Ambrose FinlandValerie A Keck, NP-C [facility] [phone] 01/11/2015, 9:12 AM

## 2015-01-11 NOTE — Patient Instructions (Signed)
I will have them recheck her BP one more time when you return in 6 weeks for the repeat TSH level. At that time if your pressure remains elevated we may need to start a very low dose BP medication.

## 2015-01-11 NOTE — Telephone Encounter (Signed)
Pt. Requesting disability parking placard

## 2015-01-11 NOTE — Progress Notes (Signed)
Patient here to establish care Has a history of thyroid disease and diabetes Needs refills on his medications

## 2015-01-12 LAB — MICROALBUMIN, URINE: MICROALB UR: 14.6 mg/dL

## 2015-01-15 ENCOUNTER — Telehealth: Payer: Self-pay

## 2015-01-15 ENCOUNTER — Telehealth: Payer: Self-pay | Admitting: Internal Medicine

## 2015-01-15 NOTE — Telephone Encounter (Signed)
Spoke with patient this pm And he is aware of his normal labs

## 2015-01-15 NOTE — Telephone Encounter (Signed)
Pt dropped off disability paperwork to be filled out.

## 2015-01-15 NOTE — Telephone Encounter (Signed)
-----   Message from Ambrose FinlandValerie A Keck, NP sent at 01/15/2015  2:05 PM EST ----- Labs are normal

## 2015-02-21 ENCOUNTER — Ambulatory Visit: Payer: Self-pay | Attending: Internal Medicine | Admitting: Pharmacist

## 2015-02-21 ENCOUNTER — Other Ambulatory Visit: Payer: Self-pay | Admitting: Internal Medicine

## 2015-02-21 ENCOUNTER — Ambulatory Visit: Payer: Self-pay

## 2015-02-21 VITALS — BP 128/68 | HR 76

## 2015-02-21 DIAGNOSIS — E079 Disorder of thyroid, unspecified: Secondary | ICD-10-CM

## 2015-02-21 DIAGNOSIS — E785 Hyperlipidemia, unspecified: Secondary | ICD-10-CM

## 2015-02-21 DIAGNOSIS — I10 Essential (primary) hypertension: Secondary | ICD-10-CM | POA: Insufficient documentation

## 2015-02-21 DIAGNOSIS — Z0000 Encounter for general adult medical examination without abnormal findings: Secondary | ICD-10-CM

## 2015-02-21 LAB — LIPID PANEL
CHOL/HDL RATIO: 3.4 ratio (ref <=5.0)
Cholesterol: 127 mg/dL (ref 125–200)
HDL: 37 mg/dL — AB (ref >=40)
LDL Cholesterol: 76 mg/dL (ref <130)
Triglycerides: 71 mg/dL (ref <150)
VLDL: 14 mg/dL (ref <30)

## 2015-02-21 LAB — T4, FREE: Free T4: 1.3 ng/dL (ref 0.8–1.8)

## 2015-02-21 LAB — TSH: TSH: 8.65 mIU/L — ABNORMAL HIGH (ref 0.40–4.50)

## 2015-02-21 MED FILL — ?LEVOTHYROXINE 100 MCG TAB: 100 | 30 days supply | Qty: 30 | Fill #1

## 2015-02-21 MED FILL — glipiZIDE 10 MG TABS: 10 | 30 days supply | Qty: 60 | Fill #1

## 2015-02-21 MED FILL — LANTUS SOLOSTAR 100 UNITS/M: 100 | 19 days supply | Qty: 6 | Fill #1

## 2015-02-21 MED FILL — PRAVASTATIN NA 20 MG TAB: 20 | 30 days supply | Qty: 30 | Fill #1

## 2015-02-21 MED FILL — METFORMIN HCL ER 500 MG TAB: 500 | 30 days supply | Qty: 120 | Fill #1

## 2015-02-21 NOTE — Progress Notes (Signed)
S:    Patient arrives in good spirits with his wife. Presents to the clinic for blood pressure evaluation.   Patient reports adherence with medications.  Current BP Medications include:  none  Patient got labs drawn this morning for his TSH and T4. He reports that he still does not feel well and is tired and has cramps. He thinks it is his thyroid.  O:   Last 3 Office BP readings: BP Readings from Last 3 Encounters:  02/21/15 128/68  01/11/15 136/89  01/04/15 136/92    BMET    Component Value Date/Time   NA 137 01/11/2015 0953   K 4.7 01/11/2015 0953   CL 99 01/11/2015 0953   CO2 28 01/11/2015 0953   GLUCOSE 154* 01/11/2015 0953   BUN 9 01/11/2015 0953   CREATININE 1.03 01/11/2015 0953   CREATININE 1.18 12/27/2009 1956   CALCIUM 8.9 01/11/2015 0953   GFRNONAA >60 11/19/2008 1543   GFRAA  11/19/2008 1543    >60        The eGFR has been calculated using the MDRD equation. This calculation has not been validated in all clinical situations. eGFR's persistently <60 mL/min signify possible Chronic Kidney Disease.    A/P: Blood pressure normal today. No need for any medication. Patient came in for labs today and I told him we will follow up with the results and make an adjustments to his thyroid medication at that time. Patient verbalized understanding.   Results reviewed and written information provided.   Total time in face-to-face counseling 10 minutes.   F/U Clinic Visit with Chari Manning, NP.

## 2015-02-21 NOTE — Patient Instructions (Signed)
Thank you for coming to see me  We will call you with the results of your lab work and go from there!  Follow up with [facility] as directed

## 2015-02-22 ENCOUNTER — Other Ambulatory Visit: Payer: PRIVATE HEALTH INSURANCE

## 2015-02-25 ENCOUNTER — Telehealth: Payer: Self-pay

## 2015-02-25 ENCOUNTER — Other Ambulatory Visit: Payer: Self-pay | Admitting: Internal Medicine

## 2015-02-25 DIAGNOSIS — E079 Disorder of thyroid, unspecified: Secondary | ICD-10-CM

## 2015-02-25 MED ORDER — LEVOTHYROXINE SODIUM 112 MCG PO TABS: 112.0000 ug | ORAL_TABLET | Freq: Every day | ORAL | Status: DC

## 2015-02-25 MED FILL — ?LEVOTHYROXINE 112 MCG TAB: 112 | 30 days supply | Qty: 30 | Fill #0

## 2015-02-25 NOTE — Telephone Encounter (Signed)
Spoke with patient and he is aware of his lab results Patient call transferred to schedule his appt for follow up  Lab visit

## 2015-02-25 NOTE — Telephone Encounter (Signed)
Scheduled lab visit for patient, but would like to talk to nurse again was transferred back. Please f/u

## 2015-02-25 NOTE — Telephone Encounter (Signed)
-----   Message from Ambrose Finland, NP sent at 02/25/2015  8:54 AM EST ----- Cholesterol looks great. Thyroid level has come down significantly and he should feel better now.  I will increase his dose slightly, After beginning new Synthroid dose he will need to return in 8 weeks again for a lab recheck

## 2015-02-27 ENCOUNTER — Other Ambulatory Visit: Payer: Self-pay | Admitting: Internal Medicine

## 2015-02-27 DIAGNOSIS — E119 Type 2 diabetes mellitus without complications: Secondary | ICD-10-CM

## 2015-02-27 MED ORDER — INSULIN GLARGINE 100 UNIT/ML SOLOSTAR PEN: 35.0000 [IU] | PEN_INJECTOR | Freq: Every day | SUBCUTANEOUS | Status: DC

## 2015-03-07 ENCOUNTER — Telehealth: Payer: Self-pay | Admitting: Internal Medicine

## 2015-03-07 NOTE — Telephone Encounter (Signed)
Patient called stating that information about medical condition, as well as future appointment information needs to be sent in to [facility] health. Patient stated that this information is needed for a disability claim Patient stated that they were supposed to receive a payment tomorrow but however payment will be delayed due to lack of information for claim. Please follow up.

## 2015-03-07 NOTE — Telephone Encounter (Signed)
Error

## 2015-03-08 ENCOUNTER — Telehealth: Payer: Self-pay

## 2015-03-08 NOTE — Telephone Encounter (Signed)
Pt. Called requesting to speak to the nurse regarding his health. Please f/u

## 2015-03-08 NOTE — Telephone Encounter (Signed)
Returned phone call to patient Patient is requesting something in writing for his insurance co cignRosann AuerbachExplained to patient that usually when insurance co are in need of additional Information, it is usually sent to [place] or faxed over Informed patient that i would check with his provider to see if paperwork  Has been sent over

## 2015-03-08 NOTE — Telephone Encounter (Signed)
Patient called stating that information about medical condition, as well as future appointment information needs to be sent in to [employer] health. °Patient stated that this information is needed for a disability claim °Patient stated that they were supposed to receive a payment tomorrow but however payment will be delayed due to lack of information for claim. °Please follow up. ° ° °

## 2015-03-11 ENCOUNTER — Telehealth: Payer: Self-pay

## 2015-03-11 NOTE — Telephone Encounter (Signed)
Spoke with patient in regards to his paperwork Patient is aware his provider will work on that this afternoon And fax it over to his insurance complany

## 2015-03-11 NOTE — Telephone Encounter (Signed)
Denise please call this patient back. I will try to complete his forms this afternoon.

## 2015-03-11 NOTE — Telephone Encounter (Signed)
Patient called wanting to know status of Fax to [place] . Patient would also like to speak to  Nurse. Please follow up.

## 2015-03-14 ENCOUNTER — Encounter: Payer: Self-pay | Admitting: Internal Medicine

## 2015-03-14 ENCOUNTER — Ambulatory Visit: Payer: PRIVATE HEALTH INSURANCE | Attending: Internal Medicine | Admitting: Internal Medicine

## 2015-03-14 VITALS — BP 142/92 | HR 87 | Temp 98.0°F | Resp 16 | Ht 70.0 in | Wt 175.0 lb

## 2015-03-14 DIAGNOSIS — E079 Disorder of thyroid, unspecified: Secondary | ICD-10-CM | POA: Diagnosis not present

## 2015-03-14 DIAGNOSIS — E119 Type 2 diabetes mellitus without complications: Secondary | ICD-10-CM | POA: Insufficient documentation

## 2015-03-14 DIAGNOSIS — Z79899 Other long term (current) drug therapy: Secondary | ICD-10-CM | POA: Insufficient documentation

## 2015-03-14 DIAGNOSIS — R030 Elevated blood-pressure reading, without diagnosis of hypertension: Secondary | ICD-10-CM | POA: Diagnosis not present

## 2015-03-14 DIAGNOSIS — Z794 Long term (current) use of insulin: Secondary | ICD-10-CM | POA: Insufficient documentation

## 2015-03-14 DIAGNOSIS — IMO0001 Reserved for inherently not codable concepts without codable children: Secondary | ICD-10-CM

## 2015-03-14 NOTE — Progress Notes (Signed)
Patient ID: Oluwafemi Villella, male   DOB: 07-Jun-1965, 50 y.o.   MRN: [id number]  CC: work note  HPI: Ahmadou Bolz is a 50 y.o. male here today for a follow up visit.  Patient has past medical history of diabetes and thyroid disease. Patient was seen by me 2 months ago for severe symptoms of hypothyroidism. Patient worked as a [employer] and was unable to work due to excessive fatigue and weakness. He was taken out of work at that time until his TSH level stabilized. He is now feeling much better and would like a release to return to work.     No Known Allergies Past Medical History  Diagnosis Date  . Diabetes mellitus without complication [facility])    Current Outpatient Prescriptions on File Prior to Visit  Medication Sig Dispense Refill  . glipiZIDE (GLUCOTROL) 10 MG tablet Take 1 tablet (10 mg total) by mouth 2 (two) times daily before a meal. 60 tablet 5  . Insulin Glargine (LANTUS SOLOSTAR) 100 UNIT/ML Solostar Pen Inject 35 Units into the skin daily at 10 pm. 30 mL 3  . Insulin Pen Needle 31G X 5 MM MISC Give insulin once per night 100 each 12  . levothyroxine (SYNTHROID, LEVOTHROID) 112 MCG tablet Take 1 tablet (112 mcg total) by mouth daily before breakfast. 30 tablet 4  . metFORMIN (GLUCOPHAGE-XR) 500 MG 24 hr tablet Take 2 tablets (1,000 mg total) by mouth 2 (two) times daily. 180 tablet 5  . pravastatin (PRAVACHOL) 20 MG tablet Take 1 tablet (20 mg total) by mouth daily. 30 tablet 5   No current facility-administered medications on file prior to visit.   No family history on file. Social History   Social History  . Marital Status: Married    Spouse Name: N/A  . Number of Children: N/A  . Years of Education: N/A   Occupational History  . Not on file.   Social History Main Topics  . Smoking status: Never Smoker   . Smokeless tobacco: Never Used  . Alcohol Use: No  . Drug Use: Not on file  . Sexual Activity: Yes    Birth Control/ Protection: None   Other Topics Concern   . Not on file   Social History Narrative    Review of Systems: Constitutional: Negative for fever, chills, diaphoresis, activity change, appetite change and fatigue. HENT: Negative for ear pain, nosebleeds, congestion, facial swelling, rhinorrhea, neck pain, neck stiffness and ear discharge.  Eyes: Negative for pain, discharge, redness, itching and visual disturbance. Respiratory: Negative for cough, choking, chest tightness, shortness of breath, wheezing and stridor.  Cardiovascular: Negative for chest pain, palpitations and leg swelling. Gastrointestinal: Negative for abdominal distention. Genitourinary: Negative for dysuria, urgency, frequency, hematuria, flank pain, decreased urine volume, difficulty urinating and dyspareunia.  Musculoskeletal: Negative for back pain, joint swelling, arthralgias and gait problem. Neurological: Negative for dizziness, tremors, seizures, syncope, facial asymmetry, speech difficulty, weakness, light-headedness, numbness and headaches.  Hematological: Negative for adenopathy. Does not bruise/bleed easily. Psychiatric/Behavioral: Negative for hallucinations, behavioral problems, confusion, dysphoric mood, decreased concentration and agitation.    Objective:   Filed Vitals:   03/14/15 0939  BP: 159/99  Pulse: 87  Temp: 98 F (36.7 C)  Resp: 16    Physical Exam  Constitutional: He is oriented to person, place, and time.  Neck: No thyromegaly present.  Cardiovascular: Normal rate, regular rhythm and normal heart sounds.   Pulmonary/Chest: Effort normal and breath sounds normal.  Musculoskeletal: He exhibits no edema.  Neurological: He is alert and oriented to person, place, and time.  Skin: Skin is warm and dry.  Psychiatric: He has a normal mood and affect.   Lab Results  Component Value Date   WBC 3.7* 01/11/2015   HGB 13.3 01/11/2015   HCT 40.4 01/11/2015   MCV 92.7 01/11/2015   PLT 263 01/11/2015   Lab Results  Component Value Date    CREATININE 1.03 01/11/2015   BUN 9 01/11/2015   NA 137 01/11/2015   K 4.7 01/11/2015   CL 99 01/11/2015   CO2 28 01/11/2015    Lab Results  Component Value Date   HGBA1C 7.90 01/11/2015   Lipid Panel     Component Value Date/Time   CHOL 127 02/21/2015 0900   TRIG 71 02/21/2015 0900   HDL 37* 02/21/2015 0900   CHOLHDL 3.4 02/21/2015 0900   VLDL 14 02/21/2015 0900   LDLCALC 76 02/21/2015 0900       Assessment and plan:   Jamond was seen today for work clearance.  Diagnoses and all orders for this visit:  Thyroid disease Patient has been evaluated and given a return to work form.   Elevated BP I have discussed going on ACE inhibitor for kidney protection and BP control. He reports that his is ready to return to work and does not want to risk developing a cough or angioedema. He would rather try to lower his pressure by exercising and changing his diet. I explained that next visit he will still need renal protection with a ACE or a ARB.  Return in about 4 weeks (around 04/11/2015) for lab visit and 3 mo PCP DM.       Ambrose Finland, NP-C [facility] [phone] 03/14/2015, 9:29 AM

## 2015-03-14 NOTE — Progress Notes (Signed)
Patient here to obtain medical clearance to return back to work

## 2015-03-26 MED FILL — PRAVASTATIN NA 20 MG TAB: 20 | 30 days supply | Qty: 30 | Fill #2

## 2015-03-26 MED FILL — ?LEVOTHYROXINE 112 MCG TAB: 112 | 30 days supply | Qty: 30 | Fill #1

## 2015-03-26 MED FILL — glipiZIDE 10 MG TABS: 10 | 30 days supply | Qty: 60 | Fill #2

## 2015-03-27 ENCOUNTER — Telehealth: Payer: Self-pay | Admitting: Internal Medicine

## 2015-03-27 NOTE — Telephone Encounter (Signed)
Patient dropped off paperwork to completed by the doctor. Please follow up.

## 2015-04-01 NOTE — Telephone Encounter (Signed)
Patient calling regarding paperwork dropped off last week...he needs the paper work urgently, please follow up with patient

## 2015-04-02 ENCOUNTER — Telehealth: Payer: Self-pay

## 2015-04-02 NOTE — Telephone Encounter (Signed)
Spoke with patient and he is aware his form is ready for pick up

## 2015-04-22 ENCOUNTER — Ambulatory Visit: Payer: PRIVATE HEALTH INSURANCE | Attending: Internal Medicine

## 2015-04-22 DIAGNOSIS — E039 Hypothyroidism, unspecified: Secondary | ICD-10-CM | POA: Insufficient documentation

## 2015-04-22 LAB — TSH: TSH: 0.65 m[IU]/L (ref 0.40–4.50)

## 2015-04-22 MED FILL — METFORMIN HCL ER 500 MG TAB: 500 | 30 days supply | Qty: 120 | Fill #2 | Status: TO

## 2015-04-22 MED FILL — LEVOTHYROXINE 112 MCG TAB: 112 | 30 days supply | Qty: 30 | Fill #2

## 2015-04-22 MED FILL — $LANTUS SOLOSTAR 100 UNITS/: 100 | 28 days supply | Qty: 12 | Fill #2

## 2015-04-22 MED FILL — PRAVASTATIN NA 20 MG TAB: 20 | 30 days supply | Qty: 30 | Fill #3

## 2015-04-22 MED FILL — ?GLIPIZIDE 10 MG TABLET: 10 | 30 days supply | Qty: 60 | Fill #3

## 2015-04-22 NOTE — Progress Notes (Signed)
Patient was here for lab visit only. Patient was informed that he will reveice a call within the next 24-72 hrs with results.

## 2015-04-25 ENCOUNTER — Telehealth: Payer: Self-pay

## 2015-04-25 NOTE — Telephone Encounter (Signed)
-----   Message from Ambrose FinlandValerie A Keck, NP sent at 04/24/2015  7:51 PM EDT ----- TSH is normal now. Will recheck in 3 months

## 2015-04-25 NOTE — Telephone Encounter (Signed)
Called patient, patient verified name and DOB. Patient was given lab results verbalized he understood with no further questions.

## 2015-05-13 MED FILL — glipiZIDE 10 MG TABS: 10 | 30 days supply | Qty: 60 | Fill #4

## 2015-05-27 ENCOUNTER — Telehealth: Payer: Self-pay | Admitting: Internal Medicine

## 2015-05-27 MED FILL — PRAVASTATIN NA 20 MG TAB: 20 | 30 days supply | Qty: 30 | Fill #4

## 2015-05-27 MED FILL — LEVOTHYROXINE 112 MCG TAB: 112 | 30 days supply | Qty: 30 | Fill #3

## 2015-05-27 NOTE — Telephone Encounter (Signed)
Pt. Called stating that he would like for his PCP to change pravastatin (PRAVACHOL) 20 MG tablet to lovastatin b/c it is much cheaper to get lovastatin. Please f/u

## 2015-05-29 ENCOUNTER — Other Ambulatory Visit: Payer: Self-pay | Admitting: Internal Medicine

## 2015-05-29 MED ORDER — LOVASTATIN 20 MG PO TABS: 20.0000 mg | ORAL_TABLET | Freq: Every day | ORAL | Status: DC

## 2015-05-29 NOTE — Telephone Encounter (Signed)
Please inform patient, Lovastatin has been prescribed to the pharmacy for pickup. Thanks

## 2015-06-13 MED FILL — glipiZIDE 10 MG TABS: 10 | 30 days supply | Qty: 60 | Fill #5 | Status: TO

## 2015-06-14 MED FILL — $LANTUS SOLOSTAR 100 UNITS/: 100 | 28 days supply | Qty: 12 | Fill #3

## 2015-06-25 NOTE — Telephone Encounter (Signed)
Patient verified DOB Patient is aware of lovastatin being ordered at [facility]. Patient had no further questions at this time.

## 2015-06-27 MED FILL — LEVOTHYROXINE 112 MCG TAB: 112 | 30 days supply | Qty: 30 | Fill #4 | Status: TO

## 2015-06-28 MED FILL — LOVASTATIN 20 MG TABLET: 20 | 30 days supply | Qty: 30 | Fill #0 | Status: TO

## 2015-07-08 ENCOUNTER — Ambulatory Visit: Payer: Self-pay | Attending: Family Medicine | Admitting: Family Medicine

## 2015-07-08 ENCOUNTER — Encounter: Payer: Self-pay | Admitting: Family Medicine

## 2015-07-08 VITALS — BP 134/87 | HR 78 | Temp 98.0°F | Resp 14 | Ht 70.0 in | Wt 171.8 lb

## 2015-07-08 DIAGNOSIS — E119 Type 2 diabetes mellitus without complications: Secondary | ICD-10-CM | POA: Insufficient documentation

## 2015-07-08 DIAGNOSIS — Z7984 Long term (current) use of oral hypoglycemic drugs: Secondary | ICD-10-CM | POA: Insufficient documentation

## 2015-07-08 DIAGNOSIS — E785 Hyperlipidemia, unspecified: Secondary | ICD-10-CM | POA: Insufficient documentation

## 2015-07-08 DIAGNOSIS — Z79899 Other long term (current) drug therapy: Secondary | ICD-10-CM | POA: Insufficient documentation

## 2015-07-08 DIAGNOSIS — E039 Hypothyroidism, unspecified: Secondary | ICD-10-CM | POA: Insufficient documentation

## 2015-07-08 DIAGNOSIS — Z794 Long term (current) use of insulin: Secondary | ICD-10-CM | POA: Insufficient documentation

## 2015-07-08 LAB — TSH: TSH: 0.42 mIU/L (ref 0.40–4.50)

## 2015-07-08 LAB — COMPLETE METABOLIC PANEL WITH GFR
ALBUMIN: 4.2 g/dL (ref 3.6–5.1)
ALT: 22 U/L (ref 9–46)
AST: 19 U/L (ref 10–40)
Alkaline Phosphatase: 72 U/L (ref 40–115)
BUN: 11 mg/dL (ref 7–25)
CO2: 29 mmol/L (ref 20–31)
Calcium: 9.2 mg/dL (ref 8.6–10.3)
Chloride: 104 mmol/L (ref 98–110)
Creat: 0.98 mg/dL (ref 0.60–1.35)
GFR, Est African American: >89 mL/min (ref >=60)
GFR, Est Non African American: >89 mL/min (ref >=60)
GLUCOSE: 243 mg/dL — AB (ref 65–99)
POTASSIUM: 4.7 mmol/L (ref 3.5–5.3)
Sodium: 140 mmol/L (ref 135–146)
Total Bilirubin: 0.6 mg/dL (ref 0.2–1.2)
Total Protein: 6.8 g/dL (ref 6.1–8.1)

## 2015-07-08 LAB — POCT GLYCOSYLATED HEMOGLOBIN (HGB A1C): Hemoglobin A1C: 8.4

## 2015-07-08 LAB — GLUCOSE, POCT (MANUAL RESULT ENTRY): POC Glucose: 220 mg/dl — AB (ref 70–99)

## 2015-07-08 MED ORDER — INSULIN GLARGINE 100 UNIT/ML SOLOSTAR PEN: 35.0000 [IU] | PEN_INJECTOR | Freq: Every day | SUBCUTANEOUS | Status: DC

## 2015-07-08 MED ORDER — METFORMIN HCL 500 MG PO TABS: ORAL_TABLET | ORAL | Status: DC

## 2015-07-08 MED ORDER — LOVASTATIN 20 MG PO TABS: 20.0000 mg | ORAL_TABLET | Freq: Every day | ORAL | Status: DC

## 2015-07-08 MED ORDER — GLIPIZIDE 10 MG PO TABS: 10.0000 mg | ORAL_TABLET | Freq: Two times a day (BID) | ORAL | Status: DC

## 2015-07-08 MED ORDER — LEVOTHYROXINE SODIUM 112 MCG PO TABS: 112.0000 ug | ORAL_TABLET | Freq: Every day | ORAL | Status: DC

## 2015-07-08 NOTE — Progress Notes (Signed)
Pt here for referral for colonoscopy. Pt has taken medications today. Pt has eaten today. CBG is 220. A1C is 8.4.

## 2015-07-08 NOTE — Progress Notes (Signed)
Subjective:  Patient ID: Ricardo Forbes, male    DOB: [birth year]  Age: 50 y.o. MRN: [id number]  CC: Referral   HPI Ricardo AmabileLeander Tarte is a 50 year old male with a history of type 2 diabetes mellitus diagnosed in 11/2003 (A1c 8.4 from today), hyperlipidemia, hypothyroidism who was previously followed by the nurse practitioner and comes to establish care with me.  He has been compliant with glipizide and metformin but has been taking 32 rather than 35 units of Lantus prescribed in his med list. He takes this in the morning as he notes he has hypoglycemia when he takes it at night and he drives trucks for a living. He is up-to-date on Pneumovax which he states he received a year or 2 ago at [facility]. Last eye exam was a few months back with his ophthalmologist.  Has been compliant with his levothyroxine for hypothyroidism  Would like to have a colonoscopy but I have explained to him that this is indicated from the age of 50; he has no family history of colon cancer and has no GI bleed or GI symptoms. Has no other concerns today.   Past Medical History  Diagnosis Date  . Diabetes mellitus without complication (HCC)     History reviewed. No pertinent past surgical history.  No Known Allergies  Current Outpatient Prescriptions on File Prior to Visit  Medication Sig Dispense Refill  . Insulin Pen Needle 31G X 5 MM MISC Give insulin once per night 100 each 12   No current facility-administered medications on file prior to visit.     Outpatient Prescriptions Prior to Visit  Medication Sig Dispense Refill  . Insulin Glargine (LANTUS SOLOSTAR) 100 UNIT/ML Solostar Pen Inject 35 Units into the skin daily at 10 pm. 30 mL 3  . Insulin Pen Needle 31G X 5 MM MISC Give insulin once per night 100 each 12  . glipiZIDE (GLUCOTROL) 10 MG tablet Take 1 tablet (10 mg total) by mouth 2 (two) times daily before a meal. 60 tablet 5  . levothyroxine (SYNTHROID, LEVOTHROID) 112 MCG tablet  Take 1 tablet (112 mcg total) by mouth daily before breakfast. 30 tablet 4  . lovastatin (MEVACOR) 20 MG tablet Take 1 tablet (20 mg total) by mouth at bedtime. 90 tablet 3  . metFORMIN (GLUCOPHAGE-XR) 500 MG 24 hr tablet Take 2 tablets (1,000 mg total) by mouth 2 (two) times daily. 180 tablet 5   No facility-administered medications prior to visit.    ROS Review of Systems  Constitutional: Negative for activity change and appetite change.  HENT: Negative for sinus pressure and sore throat.   Eyes: Negative for visual disturbance.  Respiratory: Negative for cough, chest tightness and shortness of breath.   Cardiovascular: Negative for chest pain and leg swelling.  Gastrointestinal: Negative for abdominal pain, diarrhea, constipation and abdominal distention.  Endocrine: Negative.   Genitourinary: Negative for dysuria.  Musculoskeletal: Negative for myalgias and joint swelling.  Skin: Negative for rash.  Allergic/Immunologic: Negative.   Neurological: Negative for weakness, light-headedness and numbness.  Psychiatric/Behavioral: Negative for suicidal ideas and dysphoric mood.    Objective:  BP 134/87 mmHg  Pulse 78  Temp(Src) 98 F (36.7 C) (Oral)  Resp 14  Ht 5\' 10"  (1.778 m)  Wt 171 lb 12.8 oz (77.928 kg)  BMI 24.65 kg/m2  SpO2 98%  BP/Weight 07/08/2015 03/14/2015 02/21/2015  Systolic BP 134 142 128  Diastolic BP 87 92 68  Wt. (Lbs) 171.8 175 -  BMI 24.65 25.11 -  Physical Exam  Constitutional: He is oriented to person, place, and time. He appears well-developed and well-nourished.  Cardiovascular: Normal rate, normal heart sounds and intact distal pulses.   No murmur heard. Pulmonary/Chest: Effort normal and breath sounds normal. He has no wheezes. He has no rales. He exhibits no tenderness.  Abdominal: Soft. Bowel sounds are normal. He exhibits no distension and no mass. There is no tenderness.  Musculoskeletal: Normal range of motion.  Neurological: He is alert  and oriented to person, place, and time.  Skin: Skin is warm.  Psychiatric: He has a normal mood and affect.     Lipid Panel     Component Value Date/Time   CHOL 127 02/21/2015 0900   TRIG 71 02/21/2015 0900   HDL 37* 02/21/2015 0900   CHOLHDL 3.4 02/21/2015 0900   VLDL 14 02/21/2015 0900   LDLCALC 76 02/21/2015 0900   LDLDIRECT 115* 04/30/2010 1052    Lab Results  Component Value Date   HGBA1C 8.4 07/08/2015    Assessment & Plan:   1. Type 2 diabetes mellitus without complication, with long-term current use of insulin (HCC) Uncontrolled with A1c of 8.4 which has trended up from 7.5, six months ago Increased total metformin dose from 2000 mg daily to 2500 mg daily He is currently taking Lantus 32 units and has been advised to take 35 units. 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 We have discussed initiating Invokana but he has no insurance at this time and [facility]; the patient will look into personally applying for medication assistance - discussed side effects of Invokana with him - POCT glucose (manual entry) - POCT glycosylated hemoglobin (Hb A1C) - COMPLETE METABOLIC PANEL WITH GFR - metFORMIN (GLUCOPHAGE) 500 MG tablet; Take orally twice daily 3 tabs (1500 mg) and morning and 2 tabs (1000 mg) in the evening  Dispense: 150 tablet; Refill: 3 - glipiZIDE (GLUCOTROL) 10 MG tablet; Take 1 tablet (10 mg total) by mouth 2 (two) times daily before a meal.  Dispense: 60 tablet; Refill: 3  2. Hypothyroidism, unspecified hypothyroidism type Controlled - TSH - levothyroxine (SYNTHROID, LEVOTHROID) 112 MCG tablet; Take 1 tablet (112 mcg total) by mouth daily before breakfast.  Dispense: 30 tablet; Refill: 3  3. Hyperlipidemia Controlled - lovastatin (MEVACOR) 20 MG tablet; Take 1 tablet (20 mg total) by mouth at bedtime.  Dispense: 90 tablet; Refill: 3   Meds ordered this encounter    Medications  . metFORMIN (GLUCOPHAGE) 500 MG tablet    Sig: Take orally twice daily 3 tabs (1500 mg) and morning and 2 tabs (1000 mg) in the evening    Dispense:  150 tablet    Refill:  3    Discontinue previous dose  . lovastatin (MEVACOR) 20 MG tablet    Sig: Take 1 tablet (20 mg total) by mouth at bedtime.    Dispense:  90 tablet    Refill:  3  . levothyroxine (SYNTHROID, LEVOTHROID) 112 MCG tablet    Sig: Take 1 tablet (112 mcg total) by mouth daily before breakfast.    Dispense:  30 tablet    Refill:  3    New dose  . glipiZIDE (GLUCOTROL) 10 MG tablet    Sig: Take 1 tablet (10 mg total) by mouth 2 (two) times daily before a meal.    Dispense:  60 tablet    Refill:  3    Follow-up:  Return in about 3 months (around 10/08/2015) for Follow-up on diabetes mellitus.   Jaclyn Shaggy MD

## 2015-07-09 ENCOUNTER — Telehealth: Payer: Self-pay

## 2015-07-09 NOTE — Telephone Encounter (Signed)
-----   Message from Jaclyn ShaggyEnobong Amao, MD sent at 07/09/2015  2:37 PM EDT ----- Normal thyroid; elevated glucose (for which I had increased the dose of his medications); advised to comply with a diabetic diet as well.

## 2015-07-09 NOTE — Telephone Encounter (Signed)
-----   Message from Enobong Amao, MD sent at 07/09/2015  2:37 PM EDT ----- Normal thyroid; elevated glucose (for which I had increased the dose of his medications); advised to comply with a diabetic diet as well. 

## 2015-07-09 NOTE — Telephone Encounter (Signed)
Writer called patient and LVM requesting patient to return this call for lab results.

## 2015-07-12 ENCOUNTER — Telehealth: Payer: Self-pay | Admitting: Family Medicine

## 2015-07-12 NOTE — Telephone Encounter (Signed)
Pt returning call to receive results  ° °

## 2015-07-12 NOTE — Telephone Encounter (Signed)
Writer called patient and advised him of his lab results.  Patient states that [facility].  Writer advised him that th center is closed until Wednesday 07/17/15.  Patient stated understanding.

## 2015-07-17 ENCOUNTER — Telehealth: Payer: Self-pay

## 2015-07-17 DIAGNOSIS — E119 Type 2 diabetes mellitus without complications: Secondary | ICD-10-CM

## 2015-07-17 DIAGNOSIS — Z794 Long term (current) use of insulin: Principal | ICD-10-CM

## 2015-07-17 MED ORDER — METFORMIN HCL 500 MG PO TABS: ORAL_TABLET | ORAL | Status: DC

## 2015-07-17 NOTE — Telephone Encounter (Signed)
-----   Message from Enobong Amao, MD sent at 07/09/2015  2:37 PM EDT ----- Normal thyroid; elevated glucose (for which I had increased the dose of his medications); advised to comply with a diabetic diet as well. 

## 2015-07-17 NOTE — Telephone Encounter (Signed)
Writer spoke with patient regarding his lab results.  Patient [facility] 500 [facility] [address] [facility] [place], he is unable to get off work early enough [facility].  [facility].

## 2015-07-18 ENCOUNTER — Telehealth: Payer: Self-pay

## 2015-07-18 ENCOUNTER — Other Ambulatory Visit: Payer: Self-pay | Admitting: Pharmacist

## 2015-07-18 MED ORDER — METFORMIN HCL ER 500 MG PO TB24: ORAL_TABLET | ORAL | Status: DC

## 2015-07-18 NOTE — Telephone Encounter (Signed)
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  Patient is requesting the Metformin XR.  Per Misty StanleyStacey, [facility] the Metformin XR was sent to patient's pharmacy.  Patient informed and thankful.

## 2015-07-25 ENCOUNTER — Other Ambulatory Visit: Payer: Self-pay | Admitting: Internal Medicine

## 2015-08-26 ENCOUNTER — Other Ambulatory Visit: Payer: Self-pay | Admitting: Internal Medicine

## 2015-08-26 DIAGNOSIS — E039 Hypothyroidism, unspecified: Secondary | ICD-10-CM

## 2015-08-27 ENCOUNTER — Other Ambulatory Visit: Payer: Self-pay | Admitting: Internal Medicine

## 2015-08-27 DIAGNOSIS — E039 Hypothyroidism, unspecified: Secondary | ICD-10-CM

## 2015-09-04 MED FILL — $LANTUS SOLOSTAR 100 UNITS/: 100 | 28 days supply | Qty: 12 | Fill #4

## 2015-09-30 ENCOUNTER — Ambulatory Visit: Payer: Self-pay | Admitting: Family Medicine

## 2015-10-28 ENCOUNTER — Ambulatory Visit: Payer: Self-pay | Admitting: Family Medicine

## 2015-11-15 ENCOUNTER — Other Ambulatory Visit: Payer: Self-pay | Admitting: Family Medicine

## 2015-12-14 ENCOUNTER — Other Ambulatory Visit: Payer: Self-pay | Admitting: Family Medicine

## 2015-12-23 ENCOUNTER — Telehealth: Payer: Self-pay | Admitting: Family Medicine

## 2015-12-23 MED ORDER — GLIPIZIDE 10 MG PO TABS: ORAL_TABLET | ORAL | 0 refills | Status: DC

## 2015-12-23 NOTE — Telephone Encounter (Signed)
Patient called to request medication refill for glipiZIDE (GLUCOTROL) 10 MG tablet. [facility] [address]. Pt is aware that he needs to schedule appt with PCP but there are no openings at the moment.   Thank you.

## 2015-12-26 NOTE — Telephone Encounter (Signed)
Writer called patient to let him know that his glipizide has been refilled as requested.

## 2016-01-07 ENCOUNTER — Other Ambulatory Visit: Payer: Self-pay | Admitting: Family Medicine

## 2016-01-07 DIAGNOSIS — E039 Hypothyroidism, unspecified: Secondary | ICD-10-CM

## 2016-01-14 ENCOUNTER — Ambulatory Visit: Payer: Self-pay | Attending: Family Medicine | Admitting: Family Medicine

## 2016-01-14 ENCOUNTER — Encounter: Payer: Self-pay | Admitting: Family Medicine

## 2016-01-14 ENCOUNTER — Other Ambulatory Visit: Payer: Self-pay | Admitting: Internal Medicine

## 2016-01-14 VITALS — BP 161/82 | HR 77 | Temp 98.6°F | Ht 70.0 in | Wt 174.0 lb

## 2016-01-14 DIAGNOSIS — E7800 Pure hypercholesterolemia, unspecified: Secondary | ICD-10-CM | POA: Insufficient documentation

## 2016-01-14 DIAGNOSIS — E119 Type 2 diabetes mellitus without complications: Secondary | ICD-10-CM

## 2016-01-14 DIAGNOSIS — I10 Essential (primary) hypertension: Secondary | ICD-10-CM | POA: Insufficient documentation

## 2016-01-14 DIAGNOSIS — Z794 Long term (current) use of insulin: Secondary | ICD-10-CM | POA: Insufficient documentation

## 2016-01-14 DIAGNOSIS — Z79899 Other long term (current) drug therapy: Secondary | ICD-10-CM | POA: Insufficient documentation

## 2016-01-14 DIAGNOSIS — E039 Hypothyroidism, unspecified: Secondary | ICD-10-CM | POA: Insufficient documentation

## 2016-01-14 LAB — GLUCOSE, POCT (MANUAL RESULT ENTRY): POC Glucose: 267 mg/dl — AB (ref 70–99)

## 2016-01-14 LAB — POCT GLYCOSYLATED HEMOGLOBIN (HGB A1C): HEMOGLOBIN A1C: 8.4

## 2016-01-14 MED ORDER — METFORMIN HCL ER 500 MG PO TB24: ORAL_TABLET | ORAL | 5 refills | Status: DC

## 2016-01-14 MED ORDER — LOVASTATIN 20 MG PO TABS: 20.0000 mg | ORAL_TABLET | Freq: Every day | ORAL | 1 refills | Status: DC

## 2016-01-14 MED ORDER — INSULIN GLARGINE 100 UNIT/ML SOLOSTAR PEN: 42.0000 [IU] | PEN_INJECTOR | Freq: Every day | SUBCUTANEOUS | 5 refills | Status: DC

## 2016-01-14 MED ORDER — GLIPIZIDE 10 MG PO TABS: ORAL_TABLET | ORAL | 1 refills | Status: DC

## 2016-01-14 MED ORDER — INSULIN GLARGINE 100 UNIT/ML SOLOSTAR PEN: 40.0000 [IU] | PEN_INJECTOR | Freq: Every day | SUBCUTANEOUS | 5 refills | Status: DC

## 2016-01-14 MED ORDER — LISINOPRIL 10 MG PO TABS: 10.0000 mg | ORAL_TABLET | Freq: Every day | ORAL | 1 refills | Status: DC

## 2016-01-14 MED ORDER — LEVOTHYROXINE SODIUM 112 MCG PO TABS: ORAL_TABLET | ORAL | 1 refills | Status: DC

## 2016-01-14 MED FILL — $LANTUS SOLOSTAR 100 UNITS/: 100 | 35 days supply | Qty: 15 | Fill #0

## 2016-01-14 NOTE — Progress Notes (Signed)
Subjective:  Patient ID: Ricardo Forbes, male    DOB: October 08, 1965  Age: 51 y.o. MRN: [id number]  CC: Diabetes and thyroid disease   HPI Ricardo Forbes is a 51 year old male with a history of type 2 diabetes mellitus diagnosed in 11/2003 (A1c 8.4 from today), hyperlipidemia, hypothyroidism who comes into the clinic for a follow-up visit.   He has been compliant with glipizide and metformin as well as Lantus; he drives trucks for a living. He is up-to-date on Pneumovax which he states he received 2 years ago at [facility]. Last eye exam was last year with his ophthalmologist. States the exercise he gets this mostly on the job when he has to load trucks. Denies blurry vision, hypoglycemia or numbness in extremities.  Has been compliant with his levothyroxine for hypothyroidism and statin. His blood pressure is elevated and he is not on any antihypertensive; review of his blood pressure pills he has had fluctuating values at his previous office visits.  Past Medical History:  Diagnosis Date  . Diabetes mellitus without complication (HCC)     History reviewed. No pertinent surgical history.  No Known Allergies    Outpatient Medications Prior to Visit  Medication Sig Dispense Refill  . Insulin Pen Needle 31G X 5 MM MISC Give insulin once per night 100 each 12  . glipiZIDE (GLUCOTROL) 10 MG tablet TAKE ONE TABLET BY MOUTH TWICE DAILY BEFORE MEAL(S) 60 tablet 0  . LANTUS SOLOSTAR 100 UNIT/ML Solostar Pen INJECT 35 UNITS INTO THE SKIN DAILY AT 10 PM. 15 mL 0  . levothyroxine (SYNTHROID, LEVOTHROID) 112 MCG tablet TAKE ONE TABLET BY MOUTH ONCE DAILY BEFORE BREAKFAST 30 tablet 2  . lovastatin (MEVACOR) 20 MG tablet Take 1 tablet (20 mg total) by mouth at bedtime. 90 tablet 3  . metFORMIN (GLUCOPHAGE XR) 500 MG 24 hr tablet Take 3 tablets in the morning and 2 tablets in the evening by mouth with food 150 tablet 3   No facility-administered medications prior to visit.      ROS Review of Systems  Constitutional: Negative for activity change and appetite change.  HENT: Negative for sinus pressure and sore throat.   Eyes: Negative for visual disturbance.  Respiratory: Negative for cough, chest tightness and shortness of breath.   Cardiovascular: Negative for chest pain and leg swelling.  Gastrointestinal: Negative for abdominal distention, abdominal pain, constipation and diarrhea.  Endocrine: Negative.   Genitourinary: Negative for dysuria.  Musculoskeletal: Negative for joint swelling and myalgias.  Skin: Negative for rash.  Allergic/Immunologic: Negative.   Neurological: Negative for weakness, light-headedness and numbness.  Psychiatric/Behavioral: Negative for dysphoric mood and suicidal ideas.    Objective:  BP (!) 161/82 (BP Location: Right Arm, Patient Position: Sitting, Cuff Size: Small)   Pulse 77   Temp 98.6 F (37 C) (Oral)   Ht 5\' 10"  (1.778 m)   Wt 174 lb (78.9 kg)   SpO2 99%   BMI 24.97 kg/m   BP/Weight 01/14/2016 07/08/2015 03/14/2015  Systolic BP 161 134 142  Diastolic BP 82 87 92  Wt. (Lbs) 174 171.8 175  BMI 24.97 24.65 25.11      Physical Exam  Constitutional: He is oriented to person, place, and time. He appears well-developed and well-nourished.  Cardiovascular: Normal rate, normal heart sounds and intact distal pulses.   No murmur heard. Pulmonary/Chest: Effort normal and breath sounds normal. He has no wheezes. He has no rales. He exhibits no tenderness.  Abdominal: Soft. Bowel sounds are normal. He  exhibits no distension and no mass. There is no tenderness.  Musculoskeletal: Normal range of motion.  Neurological: He is alert and oriented to person, place, and time.  Skin: Skin is warm and dry.  Psychiatric: He has a normal mood and affect.     Lab Results  Component Value Date   HGBA1C 8.4 01/14/2016    Lipid Panel     Component Value Date/Time   CHOL 127 02/21/2015 0900   TRIG 71 02/21/2015 0900   HDL  37 (L) 02/21/2015 0900   CHOLHDL 3.4 02/21/2015 0900   VLDL 14 02/21/2015 0900   LDLCALC 76 02/21/2015 0900   LDLDIRECT 115 (H) 04/30/2010 1052    CMP Latest Ref Rng & Units 07/08/2015 01/11/2015 04/30/2010  Glucose 65 - 99 mg/dL 161(W243(H) 960(A154(H) 540(J214(H)  BUN 7 - 25 mg/dL 11 9 9   Creatinine 0.60 - 1.35 mg/dL 8.110.98 9.141.03 7.821.12  Sodium 135 - 146 mmol/L 140 137 140  Potassium 3.5 - 5.3 mmol/L 4.7 4.7 4.1  Chloride 98 - 110 mmol/L 104 99 103  CO2 20 - 31 mmol/L 29 28 27   Calcium 8.6 - 10.3 mg/dL 9.2 8.9 9.7  Total Protein 6.1 - 8.1 g/dL 6.8 - -  Total Bilirubin 0.2 - 1.2 mg/dL 0.6 - -  Alkaline Phos 40 - 115 U/L 72 - -  AST 10 - 40 U/L 19 - -  ALT 9 - 46 U/L 22 - -    CMP Latest Ref Rng & Units 07/08/2015 01/11/2015 04/30/2010  Glucose 65 - 99 mg/dL 956(O243(H) 130(Q154(H) 657(Q214(H)  BUN 7 - 25 mg/dL 11 9 9   Creatinine 0.60 - 1.35 mg/dL 4.690.98 6.291.03 5.281.12  Sodium 135 - 146 mmol/L 140 137 140  Potassium 3.5 - 5.3 mmol/L 4.7 4.7 4.1  Chloride 98 - 110 mmol/L 104 99 103  CO2 20 - 31 mmol/L 29 28 27   Calcium 8.6 - 10.3 mg/dL 9.2 8.9 9.7  Total Protein 6.1 - 8.1 g/dL 6.8 - -  Total Bilirubin 0.2 - 1.2 mg/dL 0.6 - -  Alkaline Phos 40 - 115 U/L 72 - -  AST 10 - 40 U/L 19 - -  ALT 9 - 46 U/L 22 - -    Assessment & Plan:   1. Pure hypercholesterolemia LDL not at goal We'll adjust medication accordingly after lipid panel is retrieved - lovastatin (MEVACOR) 20 MG tablet; Take 1 tablet (20 mg total) by mouth at bedtime.  Dispense: 90 tablet; Refill: 1 - Lipid Panel w/reflex Direct LDL; Future  2. Acquired hypothyroidism Controlled - levothyroxine (SYNTHROID, LEVOTHROID) 112 MCG tablet; TAKE ONE TABLET BY MOUTH ONCE DAILY BEFORE BREAKFAST  Dispense: 90 tablet; Refill: 1 - TSH; Future  3. Type 2 diabetes mellitus without complication, with long-term current use of insulin (HCC) Uncontrolled with A1c of 8.4 Increase Lantus dose to 42 units at bedtime - Glucose (CBG) - HgB A1c - metFORMIN (GLUCOPHAGE XR)  500 MG 24 hr tablet; Take 3 tablets in the morning and 2 tablets in the evening by mouth with food  Dispense: 150 tablet; Refill: 5 - glipiZIDE (GLUCOTROL) 10 MG tablet; TAKE ONE TABLET BY MOUTH TWICE DAILY BEFORE MEAL(S)  Dispense: 180 tablet; Refill: 1 - COMPLETE METABOLIC PANEL WITH GFR; Future - Microalbumin / creatinine urine ratio; Future - Insulin Glargine (LANTUS SOLOSTAR) 100 UNIT/ML Solostar Pen; Inject 40 Units into the skin daily at 10 pm.  Dispense: 15 mL; Refill: 5  4. HYPERTENSION, BENIGN ESSENTIAL Uncontrolled He previously has not been  on antihypertensive. Commenced on lisinopril-side effect profile discussed - lisinopril (PRINIVIL,ZESTRIL) 10 MG tablet; Take 1 tablet (10 mg total) by mouth daily.  Dispense: 90 tablet; Refill: 1   Meds ordered this encounter  Medications  . metFORMIN (GLUCOPHAGE XR) 500 MG 24 hr tablet    Sig: Take 3 tablets in the morning and 2 tablets in the evening by mouth with food    Dispense:  150 tablet    Refill:  5  . lovastatin (MEVACOR) 20 MG tablet    Sig: Take 1 tablet (20 mg total) by mouth at bedtime.    Dispense:  90 tablet    Refill:  1  . levothyroxine (SYNTHROID, LEVOTHROID) 112 MCG tablet    Sig: TAKE ONE TABLET BY MOUTH ONCE DAILY BEFORE BREAKFAST    Dispense:  90 tablet    Refill:  1  . glipiZIDE (GLUCOTROL) 10 MG tablet    Sig: TAKE ONE TABLET BY MOUTH TWICE DAILY BEFORE MEAL(S)    Dispense:  180 tablet    Refill:  1  . lisinopril (PRINIVIL,ZESTRIL) 10 MG tablet    Sig: Take 1 tablet (10 mg total) by mouth daily.    Dispense:  90 tablet    Refill:  1  . Insulin Glargine (LANTUS SOLOSTAR) 100 UNIT/ML Solostar Pen    Sig: Inject 40 Units into the skin daily at 10 pm.    Dispense:  15 mL    Refill:  5    Follow-up: Return in about 3 months (around 04/13/2016) for Follow-up of diabetes and hypertension.   Jaclyn Shaggy MD

## 2016-01-14 NOTE — Progress Notes (Signed)
Med refills on "everything'

## 2016-01-20 ENCOUNTER — Ambulatory Visit: Payer: Self-pay | Attending: Family Medicine

## 2016-01-20 DIAGNOSIS — E7800 Pure hypercholesterolemia, unspecified: Secondary | ICD-10-CM | POA: Insufficient documentation

## 2016-01-20 DIAGNOSIS — E039 Hypothyroidism, unspecified: Secondary | ICD-10-CM | POA: Insufficient documentation

## 2016-01-20 DIAGNOSIS — Z794 Long term (current) use of insulin: Secondary | ICD-10-CM | POA: Insufficient documentation

## 2016-01-20 DIAGNOSIS — E119 Type 2 diabetes mellitus without complications: Secondary | ICD-10-CM | POA: Insufficient documentation

## 2016-01-20 LAB — COMPLETE METABOLIC PANEL WITH GFR
ALT: 21 U/L (ref 9–46)
AST: 20 U/L (ref 10–40)
Albumin: 4.2 g/dL (ref 3.6–5.1)
Alkaline Phosphatase: 71 U/L (ref 40–115)
BILIRUBIN TOTAL: 0.4 mg/dL (ref 0.2–1.2)
BUN: 12 mg/dL (ref 7–25)
CO2: 29 mmol/L (ref 20–31)
Calcium: 9.4 mg/dL (ref 8.6–10.3)
Chloride: 104 mmol/L (ref 98–110)
Creat: 1.19 mg/dL (ref 0.60–1.35)
GFR, Est African American: 83 mL/min (ref >=60)
GFR, Est Non African American: 72 mL/min (ref >=60)
GLUCOSE: 211 mg/dL — AB (ref 65–99)
Potassium: 4.5 mmol/L (ref 3.5–5.3)
Sodium: 140 mmol/L (ref 135–146)
TOTAL PROTEIN: 6.8 g/dL (ref 6.1–8.1)

## 2016-01-20 LAB — TSH: TSH: 0.57 mIU/L (ref 0.40–4.50)

## 2016-01-20 LAB — LIPID PANEL W/REFLEX DIRECT LDL
Cholesterol: 128 mg/dL (ref <200)
HDL: 36 mg/dL — ABNORMAL LOW (ref >40)
LDL-CHOLESTEROL: 77 mg/dL
Non-HDL Cholesterol (Calc): 92 mg/dL (ref <130)
Total CHOL/HDL Ratio: 3.6 Ratio (ref <5.0)
Triglycerides: 71 mg/dL (ref <150)

## 2016-01-20 NOTE — Progress Notes (Signed)
Patient here for lab visit only 

## 2016-01-21 LAB — MICROALBUMIN / CREATININE URINE RATIO
CREATININE, URINE: 234 mg/dL (ref 20–370)
MICROALB UR: 1.6 mg/dL
Microalb Creat Ratio: 7 mcg/mg creat (ref <30)

## 2016-01-23 ENCOUNTER — Telehealth: Payer: Self-pay

## 2016-01-23 NOTE — Telephone Encounter (Signed)
Writer called patient per Dr. Amao and discussed lab results.  Patient stated understanding. 

## 2016-01-23 NOTE — Telephone Encounter (Signed)
-----   Message from Jaclyn ShaggyEnobong Amao, MD sent at 01/21/2016 12:39 PM EST ----- Labs are stable

## 2016-04-20 ENCOUNTER — Other Ambulatory Visit: Payer: Self-pay | Admitting: Internal Medicine

## 2016-04-20 DIAGNOSIS — E119 Type 2 diabetes mellitus without complications: Secondary | ICD-10-CM

## 2016-04-20 MED FILL — !LANTUS SOLOSTAR 100UNITS/M: 100 | 35 days supply | Qty: 15 | Fill #1

## 2016-04-20 MED FILL — TRUEPLUS PEN NDL 32GX5/32": 32GX 5/32" | 100 days supply | Qty: 100 | Fill #0

## 2016-04-20 MED FILL — TRUEPLUS PEN NDL 32GX5/32: 32GX 5/32" | 100 days supply | Qty: 100 | Fill #0

## 2016-05-05 ENCOUNTER — Ambulatory Visit: Payer: Self-pay | Attending: Family Medicine | Admitting: Family Medicine

## 2016-05-05 ENCOUNTER — Ambulatory Visit: Payer: Self-pay

## 2016-05-05 ENCOUNTER — Encounter: Payer: Self-pay | Admitting: Family Medicine

## 2016-05-05 VITALS — BP 128/77 | HR 69 | Temp 97.8°F | Ht 70.0 in | Wt 171.6 lb

## 2016-05-05 DIAGNOSIS — I10 Essential (primary) hypertension: Secondary | ICD-10-CM | POA: Insufficient documentation

## 2016-05-05 DIAGNOSIS — E039 Hypothyroidism, unspecified: Secondary | ICD-10-CM | POA: Insufficient documentation

## 2016-05-05 DIAGNOSIS — E7800 Pure hypercholesterolemia, unspecified: Secondary | ICD-10-CM | POA: Insufficient documentation

## 2016-05-05 DIAGNOSIS — Z794 Long term (current) use of insulin: Secondary | ICD-10-CM | POA: Insufficient documentation

## 2016-05-05 DIAGNOSIS — Z23 Encounter for immunization: Secondary | ICD-10-CM

## 2016-05-05 DIAGNOSIS — E119 Type 2 diabetes mellitus without complications: Secondary | ICD-10-CM | POA: Insufficient documentation

## 2016-05-05 DIAGNOSIS — N529 Male erectile dysfunction, unspecified: Secondary | ICD-10-CM | POA: Insufficient documentation

## 2016-05-05 DIAGNOSIS — N528 Other male erectile dysfunction: Secondary | ICD-10-CM

## 2016-05-05 DIAGNOSIS — Z1159 Encounter for screening for other viral diseases: Secondary | ICD-10-CM

## 2016-05-05 LAB — GLUCOSE, POCT (MANUAL RESULT ENTRY): POC Glucose: 85 mg/dl (ref 70–99)

## 2016-05-05 LAB — POCT GLYCOSYLATED HEMOGLOBIN (HGB A1C): HEMOGLOBIN A1C: 8.1

## 2016-05-05 MED ORDER — GLIPIZIDE 10 MG PO TABS: ORAL_TABLET | ORAL | 1 refills | Status: DC

## 2016-05-05 MED ORDER — LISINOPRIL 2.5 MG PO TABS: 2.5000 mg | ORAL_TABLET | Freq: Every day | ORAL | 3 refills | Status: DC

## 2016-05-05 MED ORDER — INSULIN GLARGINE 100 UNIT/ML SOLOSTAR PEN: 38.0000 [IU] | PEN_INJECTOR | Freq: Every day | SUBCUTANEOUS | 3 refills | Status: DC

## 2016-05-05 MED ORDER — METFORMIN HCL ER 500 MG PO TB24: ORAL_TABLET | ORAL | 5 refills | Status: DC

## 2016-05-05 MED ORDER — LOVASTATIN 20 MG PO TABS: 20.0000 mg | ORAL_TABLET | Freq: Every day | ORAL | 3 refills | Status: DC

## 2016-05-05 MED ORDER — LEVOTHYROXINE SODIUM 112 MCG PO TABS: ORAL_TABLET | ORAL | 3 refills | Status: DC

## 2016-05-05 NOTE — Progress Notes (Signed)
Subjective:  Patient ID: Ricardo Forbes, male    DOB: 08-13-1965  Age: 51 y.o. MRN: [id number]  CC: Diabetes; thyroid disease; and Erectile Dysfunction   HPI Ricardo Forbes is a 51 year old male with a history of type 2 diabetes mellitus diagnosed in 11/2003 (A1c 8.1 from today down from 8.4 previously), hyperlipidemia, hypothyroidism who comes into the clinic for a follow-up visit.   He was prescribed 42 units of Lantus however he has been taking 35 units due to his 'blood sugars dropping' but his A1c is still at 8.1. He denies visual symptoms, numbness in extremities. He is also on Lisinopril 10 mg which he discontinued because his blood pressure has been normal. Compliant with his statin and his levothyroxine  Complaints of erectile dysfunction; was seen at [facility] where he was informed he had a 'low testosterone 300'. He would like to repeat it today. He has no other complaints today.   Past Medical History:  Diagnosis Date  . Diabetes mellitus without complication ([place])     History reviewed. No pertinent surgical history.  No Known Allergies   Outpatient Medications Prior to Visit  Medication Sig Dispense Refill  . ULTICARE MICRO PEN NEEDLES 32G X 4 MM MISC USE TO GIVE INSULIN ONCE PER NIGHT 100 each 12  . glipiZIDE (GLUCOTROL) 10 MG tablet TAKE ONE TABLET BY MOUTH TWICE DAILY BEFORE MEAL(S) 180 tablet 1  . Insulin Glargine (LANTUS SOLOSTAR) 100 UNIT/ML Solostar Pen Inject 42 Units into the skin daily at 10 pm. 15 mL 5  . levothyroxine (SYNTHROID, LEVOTHROID) 112 MCG tablet TAKE ONE TABLET BY MOUTH ONCE DAILY BEFORE BREAKFAST 90 tablet 1  . lovastatin (MEVACOR) 20 MG tablet Take 1 tablet (20 mg total) by mouth at bedtime. 90 tablet 1  . metFORMIN (GLUCOPHAGE XR) 500 MG 24 hr tablet Take 3 tablets in the morning and 2 tablets in the evening by mouth with food 150 tablet 5  . lisinopril (PRINIVIL,ZESTRIL) 10 MG tablet Take 1 tablet (10 mg total) by mouth daily.  (Patient not taking: Reported on 05/05/2016) 90 tablet 1   No facility-administered medications prior to visit.     ROS Review of Systems  Constitutional: Negative for activity change and appetite change.  HENT: Negative for sinus pressure and sore throat.   Eyes: Negative for visual disturbance.  Respiratory: Negative for cough, chest tightness and shortness of breath.   Cardiovascular: Negative for chest pain and leg swelling.  Gastrointestinal: Negative for abdominal distention, abdominal pain, constipation and diarrhea.  Endocrine: Negative.   Genitourinary: Negative for dysuria.  Musculoskeletal: Negative for joint swelling and myalgias.  Skin: Negative for rash.  Allergic/Immunologic: Negative.   Neurological: Negative for weakness, light-headedness and numbness.  Psychiatric/Behavioral: Negative for dysphoric mood and suicidal ideas.    Objective:  BP 128/77 (BP Location: Right Arm, Patient Position: Sitting, Cuff Size: Small)   Pulse 69   Temp 97.8 F (36.6 C) (Oral)   Ht 5' 10"  (1.778 m)   Wt 171 lb 9.6 oz (77.8 kg)   SpO2 99%   BMI 24.62 kg/m   BP/Weight 05/05/2016 01/14/2016 5/57/3220  Systolic BP 254 270 623  Diastolic BP 77 82 87  Wt. (Lbs) 171.6 174 171.8  BMI 24.62 24.97 24.65      Physical Exam  Constitutional: He is oriented to person, place, and time. He appears well-developed and well-nourished.  Cardiovascular: Normal rate, normal heart sounds and intact distal pulses.   No murmur heard. Pulmonary/Chest: Effort normal and  breath sounds normal. He has no wheezes. He has no rales. He exhibits no tenderness.  Abdominal: Soft. Bowel sounds are normal. He exhibits no distension and no mass. There is no tenderness.  Musculoskeletal: Normal range of motion.  Neurological: He is alert and oriented to person, place, and time.  Skin: Skin is warm and dry.  Psychiatric: He has a normal mood and affect.     Assessment & Plan:   1. HYPERTENSION, BENIGN  ESSENTIAL Uncontrolled despite not taking lisinopril I have reduced his dose of lisinopril from 10 mg to 2.5 mg to prevent hypotension ACE inhibitor still on board for renal protection - lisinopril (PRINIVIL,ZESTRIL) 2.5 MG tablet; Take 1 tablet (2.5 mg total) by mouth daily.  Dispense: 30 tablet; Refill: 3  2. Type 2 diabetes mellitus without complication, with long-term current use of insulin (HCC) Uncontrolled with A1c of 8.1 He has been taking 35 units of Lantus which have increased to 38 units and advised him to up titrate or down titrate by 2 units until blood sugars at goal Keep blood sugar logs with fasting goals of 80-120 mg/dl, random of less than 180 and in the event of sugars less than 60 mg/dl or greater than 400 mg/dl please notify the clinic ASAP. It is recommended that you undergo annual eye exams and annual foot exams. Pneumovax is recommended every 5 years before the age of 48 and once for a lifetime at or after the age of 60. - Glucose (CBG) - HgB A1c - glipiZIDE (GLUCOTROL) 10 MG tablet; TAKE ONE TABLET BY MOUTH TWICE DAILY BEFORE MEAL(S)  Dispense: 180 tablet; Refill: 1 - Insulin Glargine (LANTUS SOLOSTAR) 100 UNIT/ML Solostar Pen; Inject 38 Units into the skin daily at 10 pm.  Dispense: 15 mL; Refill: 3 - metFORMIN (GLUCOPHAGE XR) 500 MG 24 hr tablet; Take 3 tablets in the morning and 2 tablets in the evening by mouth with food  Dispense: 150 tablet; Refill: 5 - CMP14+EGFR  3. Acquired hypothyroidism - levothyroxine (SYNTHROID, LEVOTHROID) 112 MCG tablet; TAKE ONE TABLET BY MOUTH ONCE DAILY BEFORE BREAKFAST  Dispense: 30 tablet; Refill: 3 - TSH  4. Pure hypercholesterolemia Low cholesterol diet - lovastatin (MEVACOR) 20 MG tablet; Take 1 tablet (20 mg total) by mouth at bedtime.  Dispense: 30 tablet; Refill: 3  5. Other male erectile dysfunction - Testosterone Total,Free,Bio, Males  6. Screening for viral disease - HIV antibody (with reflex)   Meds ordered this  encounter  Medications  . lisinopril (PRINIVIL,ZESTRIL) 2.5 MG tablet    Sig: Take 1 tablet (2.5 mg total) by mouth daily.    Dispense:  30 tablet    Refill:  3  . glipiZIDE (GLUCOTROL) 10 MG tablet    Sig: TAKE ONE TABLET BY MOUTH TWICE DAILY BEFORE MEAL(S)    Dispense:  180 tablet    Refill:  1  . Insulin Glargine (LANTUS SOLOSTAR) 100 UNIT/ML Solostar Pen    Sig: Inject 38 Units into the skin daily at 10 pm.    Dispense:  15 mL    Refill:  3    Discontinue previous dose  . levothyroxine (SYNTHROID, LEVOTHROID) 112 MCG tablet    Sig: TAKE ONE TABLET BY MOUTH ONCE DAILY BEFORE BREAKFAST    Dispense:  30 tablet    Refill:  3  . lovastatin (MEVACOR) 20 MG tablet    Sig: Take 1 tablet (20 mg total) by mouth at bedtime.    Dispense:  30 tablet    Refill:  3  . metFORMIN (GLUCOPHAGE XR) 500 MG 24 hr tablet    Sig: Take 3 tablets in the morning and 2 tablets in the evening by mouth with food    Dispense:  150 tablet    Refill:  5    Follow-up: Return in about 3 months (around 08/04/2016) for Follow-up on diabetes mellitus.   Arnoldo Morale MD

## 2016-05-05 NOTE — Patient Instructions (Signed)
Diabetes Mellitus and Food It is important for you to manage your blood sugar (glucose) level. Your blood glucose level can be greatly affected by what you eat. Eating healthier foods in the appropriate amounts throughout the day at about the same time each day will help you control your blood glucose level. It can also help slow or prevent worsening of your diabetes mellitus. Healthy eating may even help you improve the level of your blood pressure and reach or maintain a healthy weight. General recommendations for healthful eating and cooking habits include:  Eating meals and snacks regularly. Avoid going long periods of time without eating to lose weight.  Eating a diet that consists mainly of plant-based foods, such as fruits, vegetables, nuts, legumes, and whole grains.  Using low-heat cooking methods, such as baking, instead of high-heat cooking methods, such as deep frying.  Work with your dietitian to make sure you understand how to use the Nutrition Facts information on food labels. How can food affect me? Carbohydrates Carbohydrates affect your blood glucose level more than any other type of food. Your dietitian will help you determine how many carbohydrates to eat at each meal and teach you how to count carbohydrates. Counting carbohydrates is important to keep your blood glucose at a healthy level, especially if you are using insulin or taking certain medicines for diabetes mellitus. Alcohol Alcohol can cause sudden decreases in blood glucose (hypoglycemia), especially if you use insulin or take certain medicines for diabetes mellitus. Hypoglycemia can be a life-threatening condition. Symptoms of hypoglycemia (sleepiness, dizziness, and disorientation) are similar to symptoms of having too much alcohol. If your health care provider has given you approval to drink alcohol, do so in moderation and use the following guidelines:  Women should not have more than one drink per day, and men  should not have more than two drinks per day. One drink is equal to: ? 12 oz of beer. ? 5 oz of wine. ? 1 oz of hard liquor.  Do not drink on an empty stomach.  Keep yourself hydrated. Have water, diet soda, or unsweetened iced tea.  Regular soda, juice, and other mixers might contain a lot of carbohydrates and should be counted.  What foods are not recommended? As you make food choices, it is important to remember that all foods are not the same. Some foods have fewer nutrients per serving than other foods, even though they might have the same number of calories or carbohydrates. It is difficult to get your body what it needs when you eat foods with fewer nutrients. Examples of foods that you should avoid that are high in calories and carbohydrates but low in nutrients include:  Trans fats (most processed foods list trans fats on the Nutrition Facts label).  Regular soda.  Juice.  Candy.  Sweets, such as cake, pie, doughnuts, and cookies.  Fried foods.  What foods can I eat? Eat nutrient-rich foods, which will nourish your body and keep you healthy. The food you should eat also will depend on several factors, including:  The calories you need.  The medicines you take.  Your weight.  Your blood glucose level.  Your blood pressure level.  Your cholesterol level.  You should eat a variety of foods, including:  Protein. ? Lean cuts of meat. ? Proteins low in saturated fats, such as fish, egg whites, and beans. Avoid processed meats.  Fruits and vegetables. ? Fruits and vegetables that may help control blood glucose levels, such as apples,   mangoes, and yams.  Dairy products. ? Choose fat-free or low-fat dairy products, such as milk, yogurt, and cheese.  Grains, bread, pasta, and rice. ? Choose whole grain products, such as multigrain bread, whole oats, and brown rice. These foods may help control blood pressure.  Fats. ? Foods containing healthful fats, such as  nuts, avocado, olive oil, canola oil, and fish.  Does everyone with diabetes mellitus have the same meal plan? Because every person with diabetes mellitus is different, there is not one meal plan that works for everyone. It is very important that you meet with a dietitian who will help you create a meal plan that is just right for you. This information is not intended to replace advice given to you by your health care provider. Make sure you discuss any questions you have with your health care provider. Document Released: 09/25/2004 Document Revised: 06/06/2015 Document Reviewed: 11/25/2012 Elsevier Interactive Patient Education  2017 [employer]  

## 2016-05-06 LAB — CMP14+EGFR
ALT: 29 IU/L (ref 0–44)
AST: 26 IU/L (ref 0–40)
Albumin/Globulin Ratio: 1.8 (ref 1.2–2.2)
Albumin: 4.6 g/dL (ref 3.5–5.5)
Alkaline Phosphatase: 72 IU/L (ref 39–117)
BUN/Creatinine Ratio: 14 (ref 9–20)
BUN: 14 mg/dL (ref 6–24)
Bilirubin Total: 0.3 mg/dL (ref 0.0–1.2)
CALCIUM: 9.9 mg/dL (ref 8.7–10.2)
CO2: 26 mmol/L (ref 18–29)
Chloride: 101 mmol/L (ref 96–106)
Creatinine, Ser: 0.98 mg/dL (ref 0.76–1.27)
GFR, EST AFRICAN AMERICAN: 105 mL/min/{1.73_m2} (ref >59)
GFR, EST NON AFRICAN AMERICAN: 91 mL/min/{1.73_m2} (ref >59)
GLUCOSE: 96 mg/dL (ref 65–99)
Globulin, Total: 2.5 g/dL (ref 1.5–4.5)
Potassium: 4.5 mmol/L (ref 3.5–5.2)
Sodium: 143 mmol/L (ref 134–144)
TOTAL PROTEIN: 7.1 g/dL (ref 6.0–8.5)

## 2016-05-06 LAB — TSH: TSH: 0.647 u[IU]/mL (ref 0.450–4.500)

## 2016-05-06 LAB — HIV ANTIBODY (ROUTINE TESTING W REFLEX): HIV Screen 4th Generation wRfx: NONREACTIVE

## 2016-05-08 ENCOUNTER — Other Ambulatory Visit: Payer: Self-pay

## 2016-05-08 DIAGNOSIS — E039 Hypothyroidism, unspecified: Secondary | ICD-10-CM

## 2016-05-08 DIAGNOSIS — Z794 Long term (current) use of insulin: Principal | ICD-10-CM

## 2016-05-08 DIAGNOSIS — E119 Type 2 diabetes mellitus without complications: Secondary | ICD-10-CM

## 2016-05-08 LAB — TESTOSTERONE, TOTAL, LC/MS: TESTOSTERONE, TOTAL: 427.1 ng/dL (ref 264.0–916.0)

## 2016-05-08 MED ORDER — INSULIN GLARGINE 100 UNIT/ML SOLOSTAR PEN: 38.0000 [IU] | PEN_INJECTOR | Freq: Every day | SUBCUTANEOUS | 3 refills | Status: DC

## 2016-05-08 MED ORDER — LEVOTHYROXINE SODIUM 112 MCG PO TABS: ORAL_TABLET | ORAL | 3 refills | Status: DC

## 2016-05-12 ENCOUNTER — Telehealth: Payer: Self-pay

## 2016-05-12 NOTE — Telephone Encounter (Signed)
Writer called patient and informed him of his lab results.  Patient stated understanding. 

## 2016-05-12 NOTE — Telephone Encounter (Signed)
-----   Message from Jaclyn Shaggy, MD sent at 05/08/2016 12:11 PM EDT ----- Please inform the patient that labs are normal. Thank you.

## 2016-06-16 MED FILL — LEVOTHYROXINE 112 MCG TAB: 112 | 30 days supply | Qty: 30 | Fill #0

## 2016-06-16 MED FILL — metFORMIN HCL 500 MG TABS: 500 | 30 days supply | Qty: 150 | Fill #0

## 2016-06-16 MED FILL — LOVASTATIN 20 MG TABLET: 20 | 90 days supply | Qty: 90 | Fill #0

## 2016-06-16 MED FILL — $LANTUS SOLOSTAR 100 UNITS/: 100 | 35 days supply | Qty: 15 | Fill #2

## 2016-06-16 MED FILL — glipiZIDE 10 MG TABS: 10 | 30 days supply | Qty: 60 | Fill #0

## 2016-08-17 ENCOUNTER — Encounter: Payer: Self-pay | Admitting: Family Medicine

## 2016-08-17 ENCOUNTER — Ambulatory Visit: Payer: Self-pay | Attending: Family Medicine | Admitting: Family Medicine

## 2016-08-17 VITALS — BP 127/70 | HR 76 | Temp 98.7°F | Resp 18 | Ht 70.0 in | Wt 171.8 lb

## 2016-08-17 DIAGNOSIS — Z9119 Patient's noncompliance with other medical treatment and regimen: Secondary | ICD-10-CM | POA: Insufficient documentation

## 2016-08-17 DIAGNOSIS — E1165 Type 2 diabetes mellitus with hyperglycemia: Secondary | ICD-10-CM | POA: Insufficient documentation

## 2016-08-17 DIAGNOSIS — E7800 Pure hypercholesterolemia, unspecified: Secondary | ICD-10-CM

## 2016-08-17 DIAGNOSIS — E119 Type 2 diabetes mellitus without complications: Secondary | ICD-10-CM

## 2016-08-17 DIAGNOSIS — I10 Essential (primary) hypertension: Secondary | ICD-10-CM

## 2016-08-17 DIAGNOSIS — E039 Hypothyroidism, unspecified: Secondary | ICD-10-CM

## 2016-08-17 DIAGNOSIS — Z794 Long term (current) use of insulin: Secondary | ICD-10-CM

## 2016-08-17 LAB — GLUCOSE, POCT (MANUAL RESULT ENTRY): POC Glucose: 191 mg/dl — AB (ref 70–99)

## 2016-08-17 LAB — POCT GLYCOSYLATED HEMOGLOBIN (HGB A1C): Hemoglobin A1C: 8.4

## 2016-08-17 MED ORDER — GLIPIZIDE 10 MG PO TABS: ORAL_TABLET | ORAL | 1 refills | Status: DC

## 2016-08-17 MED ORDER — LEVOTHYROXINE SODIUM 112 MCG PO TABS: ORAL_TABLET | ORAL | 3 refills | Status: DC

## 2016-08-17 MED ORDER — METFORMIN HCL ER 500 MG PO TB24: ORAL_TABLET | ORAL | 5 refills | Status: DC

## 2016-08-17 MED ORDER — LOVASTATIN 20 MG PO TABS: 20.0000 mg | ORAL_TABLET | Freq: Every day | ORAL | 3 refills | Status: DC

## 2016-08-17 MED ORDER — INSULIN GLARGINE 100 UNIT/ML SOLOSTAR PEN: 30.0000 [IU] | PEN_INJECTOR | Freq: Every day | SUBCUTANEOUS | 3 refills | Status: DC

## 2016-08-17 MED ORDER — LISINOPRIL 2.5 MG PO TABS: 2.5000 mg | ORAL_TABLET | Freq: Every day | ORAL | 3 refills | Status: DC

## 2016-08-17 MED FILL — $LANTUS SOLOSTAR 100 UNITS/: 100 | 30 days supply | Qty: 9 | Fill #0

## 2016-08-17 NOTE — Progress Notes (Signed)
Subjective:  Patient ID: Ricardo Forbes, male    DOB: [birth year]  Age: 51 y.o. MRN: [id number]  CC: Diabetes   HPI Ricardo Forbes is a 51 year old male with a history of type 2 diabetes mellitus diagnosed in 11/2003 (A1c 8.4 from today, up from 8.1 previously), hyperlipidemia, hypothyroidism who comes into the clinic for a follow-up visit.   He was prescribed 38 units of Lantus however he has been taking 25 units due to his 'blood sugars dropping' but his A1c is still at 8.4. He reports fasting sugars in the 150 range and when he refers to dropping he states his blood sugars are about 60-75. He denies visual symptoms, numbness in extremities. Last eye exam was last month.  Tolerating his antihypertensive  With no side effects. Compliant with his statin and his levothyroxine and denies myalgias, chest pains or shortness of breath.  Past Medical History:  Diagnosis Date  . Diabetes mellitus without complication ([place])     History reviewed. No pertinent surgical history.  No Known Allergies   Outpatient Medications Prior to Visit  Medication Sig Dispense Refill  . ULTICARE MICRO PEN NEEDLES 32G X 4 MM MISC USE TO GIVE INSULIN ONCE PER NIGHT 100 each 12  . glipiZIDE (GLUCOTROL) 10 MG tablet TAKE ONE TABLET BY MOUTH TWICE DAILY BEFORE MEAL(S) 180 tablet 1  . Insulin Glargine (LANTUS SOLOSTAR) 100 UNIT/ML Solostar Pen Inject 38 Units into the skin daily at 10 pm. 45 mL 3  . levothyroxine (SYNTHROID, LEVOTHROID) 112 MCG tablet TAKE ONE TABLET BY MOUTH ONCE DAILY BEFORE BREAKFAST 90 tablet 3  . lisinopril (PRINIVIL,ZESTRIL) 2.5 MG tablet Take 1 tablet (2.5 mg total) by mouth daily. 30 tablet 3  . lovastatin (MEVACOR) 20 MG tablet Take 1 tablet (20 mg total) by mouth at bedtime. 30 tablet 3  . metFORMIN (GLUCOPHAGE XR) 500 MG 24 hr tablet Take 3 tablets in the morning and 2 tablets in the evening by mouth with food 150 tablet 5   No facility-administered medications prior to visit.      ROS Review of Systems  Constitutional: Negative for activity change and appetite change.  HENT: Negative for sinus pressure and sore throat.   Eyes: Negative for visual disturbance.  Respiratory: Negative for cough, chest tightness and shortness of breath.   Cardiovascular: Negative for chest pain and leg swelling.  Gastrointestinal: Negative for abdominal distention, abdominal pain, constipation and diarrhea.  Endocrine: Negative.   Genitourinary: Negative for dysuria.  Musculoskeletal: Negative for joint swelling and myalgias.  Skin: Negative for rash.  Allergic/Immunologic: Negative.   Neurological: Negative for weakness, light-headedness and numbness.  Psychiatric/Behavioral: Negative for dysphoric mood and suicidal ideas.    Objective:  BP 127/70 (BP Location: Left Arm, Patient Position: Sitting, Cuff Size: Normal)   Pulse 76   Temp 98.7 F (37.1 C) (Oral)   Resp 18   Ht 5' 10"  (1.778 m)   Wt 171 lb 12.8 oz (77.9 kg)   SpO2 100%   BMI 24.65 kg/m   BP/Weight 08/17/2016 01/31/1476 02/21/5619  Systolic BP 308 657 846  Diastolic BP 70 77 82  Wt. (Lbs) 171.8 171.6 174  BMI 24.65 24.62 24.97      Physical Exam  Constitutional: He is oriented to person, place, and time. He appears well-developed and well-nourished.  Cardiovascular: Normal rate, normal heart sounds and intact distal pulses.   No murmur heard. Pulmonary/Chest: Effort normal and breath sounds normal. He has no wheezes. He has no rales. He exhibits  no tenderness.  Abdominal: Soft. Bowel sounds are normal. He exhibits no distension and no mass. There is no tenderness.  Musculoskeletal: Normal range of motion.  Neurological: He is alert and oriented to person, place, and time.  Skin: Skin is warm and dry.  Psychiatric: He has a normal mood and affect.     CMP Latest Ref Rng & Units 05/05/2016 01/20/2016 07/08/2015  Glucose 65 - 99 mg/dL 96 211(H) 243(H)  BUN 6 - 24 mg/dL 14 12 11   Creatinine 0.76 - 1.27  mg/dL 0.98 1.19 0.98  Sodium 134 - 144 mmol/L 143 140 140  Potassium 3.5 - 5.2 mmol/L 4.5 4.5 4.7  Chloride 96 - 106 mmol/L 101 104 104  CO2 18 - 29 mmol/L 26 29 29   Calcium 8.7 - 10.2 mg/dL 9.9 9.4 9.2  Total Protein 6.0 - 8.5 g/dL 7.1 6.8 6.8  Total Bilirubin 0.0 - 1.2 mg/dL 0.3 0.4 0.6  Alkaline Phos 39 - 117 IU/L 72 71 72  AST 0 - 40 IU/L 26 20 19   ALT 0 - 44 IU/L 29 21 22     Lipid Panel     Component Value Date/Time   CHOL 128 01/20/2016 0831   TRIG 71 01/20/2016 0831   HDL 36 (L) 01/20/2016 0831   CHOLHDL 3.6 01/20/2016 0831   VLDL 14 02/21/2015 0900   LDLCALC 76 02/21/2015 0900   LDLDIRECT 115 (H) 04/30/2010 1052    Lab Results  Component Value Date   HGBA1C 8.4 08/17/2016    Lab Results  Component Value Date   TSH 0.647 05/05/2016    Assessment & Plan:   1. Type 2 diabetes mellitus without complication, with long-term current use of insulin (HCC) Uncontrolled with A1c of 8.4 due to noncompliance with prescribed regimen Advised to take 30 units of Lantus rather than the 25 he has been taking Continue metformin and glipizide Diabetic diet - POCT A1C - Glucose (CBG) - glipiZIDE (GLUCOTROL) 10 MG tablet; TAKE ONE TABLET BY MOUTH TWICE DAILY BEFORE MEAL(S)  Dispense: 180 tablet; Refill: 1 - metFORMIN (GLUCOPHAGE XR) 500 MG 24 hr tablet; Take 3 tablets in the morning and 2 tablets in the evening by mouth with food  Dispense: 150 tablet; Refill: 5 - Insulin Glargine (LANTUS SOLOSTAR) 100 UNIT/ML Solostar Pen; Inject 30 Units into the skin daily at 10 pm.  Dispense: 45 mL; Refill: 3 - CMP14+EGFR - Lipid panel  2. Acquired hypothyroidism Controlled - levothyroxine (SYNTHROID, LEVOTHROID) 112 MCG tablet; TAKE ONE TABLET BY MOUTH ONCE DAILY BEFORE BREAKFAST  Dispense: 30 tablet; Refill: 3 - TSH  3. HYPERTENSION, BENIGN ESSENTIAL Controlled - lisinopril (PRINIVIL,ZESTRIL) 2.5 MG tablet; Take 1 tablet (2.5 mg total) by mouth daily.  Dispense: 30 tablet; Refill:  3  4. Pure hypercholesterolemia Controlled Low-cholesterol diet - lovastatin (MEVACOR) 20 MG tablet; Take 1 tablet (20 mg total) by mouth at bedtime.  Dispense: 30 tablet; Refill: 3   Meds ordered this encounter  Medications  . glipiZIDE (GLUCOTROL) 10 MG tablet    Sig: TAKE ONE TABLET BY MOUTH TWICE DAILY BEFORE MEAL(S)    Dispense:  180 tablet    Refill:  1  . levothyroxine (SYNTHROID, LEVOTHROID) 112 MCG tablet    Sig: TAKE ONE TABLET BY MOUTH ONCE DAILY BEFORE BREAKFAST    Dispense:  30 tablet    Refill:  3  . metFORMIN (GLUCOPHAGE XR) 500 MG 24 hr tablet    Sig: Take 3 tablets in the morning and 2 tablets in the  evening by mouth with food    Dispense:  150 tablet    Refill:  5  . lisinopril (PRINIVIL,ZESTRIL) 2.5 MG tablet    Sig: Take 1 tablet (2.5 mg total) by mouth daily.    Dispense:  30 tablet    Refill:  3  . lovastatin (MEVACOR) 20 MG tablet    Sig: Take 1 tablet (20 mg total) by mouth at bedtime.    Dispense:  30 tablet    Refill:  3  . Insulin Glargine (LANTUS SOLOSTAR) 100 UNIT/ML Solostar Pen    Sig: Inject 30 Units into the skin daily at 10 pm.    Dispense:  45 mL    Refill:  3    Discontinue previous dose    Follow-up: Return in about 3 months (around 11/17/2016) for Follow-up on diabetes mellitus.   This note has been created with Surveyor, quantity. Any transcriptional errors are unintentional.     Arnoldo Morale MD

## 2016-08-17 NOTE — Patient Instructions (Signed)

## 2016-08-18 LAB — CMP14+EGFR
ALBUMIN: 4.2 g/dL (ref 3.5–5.5)
ALK PHOS: 83 IU/L (ref 39–117)
ALT: 22 IU/L (ref 0–44)
AST: 35 IU/L (ref 0–40)
Albumin/Globulin Ratio: 1.9 (ref 1.2–2.2)
BILIRUBIN TOTAL: 0.3 mg/dL (ref 0.0–1.2)
BUN / CREAT RATIO: 9 (ref 9–20)
BUN: 10 mg/dL (ref 6–24)
CHLORIDE: 105 mmol/L (ref 96–106)
CO2: 22 mmol/L (ref 20–29)
CREATININE: 1.14 mg/dL (ref 0.76–1.27)
Calcium: 9.1 mg/dL (ref 8.7–10.2)
GFR calc non Af Amer: 75 mL/min/{1.73_m2} (ref >59)
GFR, EST AFRICAN AMERICAN: 87 mL/min/{1.73_m2} (ref >59)
GLOBULIN, TOTAL: 2.2 g/dL (ref 1.5–4.5)
Glucose: 200 mg/dL — ABNORMAL HIGH (ref 65–99)
Potassium: 4.5 mmol/L (ref 3.5–5.2)
SODIUM: 143 mmol/L (ref 134–144)
Total Protein: 6.4 g/dL (ref 6.0–8.5)

## 2016-08-18 LAB — LIPID PANEL
CHOL/HDL RATIO: 3.4 ratio (ref 0.0–5.0)
CHOLESTEROL TOTAL: 131 mg/dL (ref 100–199)
HDL: 38 mg/dL — ABNORMAL LOW (ref >39)
LDL CALC: 79 mg/dL (ref 0–99)
Triglycerides: 71 mg/dL (ref 0–149)
VLDL Cholesterol Cal: 14 mg/dL (ref 5–40)

## 2016-08-18 LAB — TSH: TSH: 1.07 u[IU]/mL (ref 0.450–4.500)

## 2016-08-25 ENCOUNTER — Telehealth: Payer: Self-pay

## 2016-08-25 NOTE — Telephone Encounter (Signed)
Pt was called and informed of lab results. 

## 2016-08-27 ENCOUNTER — Other Ambulatory Visit: Payer: Self-pay | Admitting: Family Medicine

## 2016-08-27 DIAGNOSIS — E119 Type 2 diabetes mellitus without complications: Secondary | ICD-10-CM

## 2016-08-27 DIAGNOSIS — Z794 Long term (current) use of insulin: Principal | ICD-10-CM

## 2016-11-13 MED FILL — $LANTUS SOLOSTAR 100 UNITS/: 100 | 30 days supply | Qty: 9 | Fill #1

## 2016-11-23 ENCOUNTER — Ambulatory Visit: Payer: Self-pay | Admitting: Family Medicine

## 2016-12-14 ENCOUNTER — Encounter: Payer: Self-pay | Admitting: Family Medicine

## 2016-12-14 ENCOUNTER — Ambulatory Visit: Payer: Self-pay | Attending: Family Medicine | Admitting: Family Medicine

## 2016-12-14 ENCOUNTER — Ambulatory Visit: Payer: Self-pay | Admitting: Family Medicine

## 2016-12-14 VITALS — BP 142/80 | HR 68 | Temp 98.1°F | Ht 70.0 in | Wt 175.6 lb

## 2016-12-14 DIAGNOSIS — Z794 Long term (current) use of insulin: Secondary | ICD-10-CM

## 2016-12-14 DIAGNOSIS — Z79899 Other long term (current) drug therapy: Secondary | ICD-10-CM | POA: Insufficient documentation

## 2016-12-14 DIAGNOSIS — Z23 Encounter for immunization: Secondary | ICD-10-CM

## 2016-12-14 DIAGNOSIS — R5383 Other fatigue: Secondary | ICD-10-CM

## 2016-12-14 DIAGNOSIS — E119 Type 2 diabetes mellitus without complications: Secondary | ICD-10-CM

## 2016-12-14 DIAGNOSIS — E039 Hypothyroidism, unspecified: Secondary | ICD-10-CM

## 2016-12-14 DIAGNOSIS — E7800 Pure hypercholesterolemia, unspecified: Secondary | ICD-10-CM

## 2016-12-14 DIAGNOSIS — I10 Essential (primary) hypertension: Secondary | ICD-10-CM

## 2016-12-14 DIAGNOSIS — Z79890 Hormone replacement therapy (postmenopausal): Secondary | ICD-10-CM | POA: Insufficient documentation

## 2016-12-14 LAB — GLUCOSE, POCT (MANUAL RESULT ENTRY): POC Glucose: 113 mg/dl — AB (ref 70–99)

## 2016-12-14 MED ORDER — INSULIN PEN NEEDLE 32G X 4 MM MISC: 12 refills | Status: DC

## 2016-12-14 MED ORDER — METFORMIN HCL ER 500 MG PO TB24: ORAL_TABLET | ORAL | 5 refills | Status: DC

## 2016-12-14 MED ORDER — LOVASTATIN 20 MG PO TABS: 20.0000 mg | ORAL_TABLET | Freq: Every day | ORAL | 5 refills | Status: DC

## 2016-12-14 MED ORDER — LISINOPRIL 2.5 MG PO TABS: 2.5000 mg | ORAL_TABLET | Freq: Every day | ORAL | 5 refills | Status: DC

## 2016-12-14 MED ORDER — GLIPIZIDE 10 MG PO TABS: ORAL_TABLET | ORAL | 1 refills | Status: DC

## 2016-12-14 MED ORDER — INSULIN GLARGINE 100 UNIT/ML SOLOSTAR PEN: 30.0000 [IU] | PEN_INJECTOR | Freq: Every day | SUBCUTANEOUS | 5 refills | Status: DC

## 2016-12-14 MED ORDER — LEVOTHYROXINE SODIUM 112 MCG PO TABS: ORAL_TABLET | ORAL | 5 refills | Status: DC

## 2016-12-14 NOTE — Progress Notes (Signed)
Subjective:  Patient ID: Ricardo Forbes, male    DOB: 09-25-1965  Age: 51 y.o. MRN: [id number]  CC: Diabetes   HPI [name]  is a 51 year old male with a history of type 2 diabetes mellitus diagnosed in 11/2003 (A1c 8.4 ), hyperlipidemia, hypothyroidism who comes into the clinic for a follow-up visit.   Endorses compliance with his Lantus and denies hypoglycemia, visual concerns or neuropathy. He is up-to-date on annual eye exams and has been compliant with a diabetic diet.  He complains of fatigue even with no much exertion.  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 His fatigue affects his sex drive. Denies shortness of breath or chest pain.  He has been tolerating his statin and denies any adverse effects. Endorses compliance with his levothyroxine.  Past Medical History:  Diagnosis Date  . Diabetes mellitus without complication ([place])     No past surgical history on file.  No Known Allergies  Outpatient Medications Prior to Visit  Medication Sig Dispense Refill  . glipiZIDE (GLUCOTROL) 10 MG tablet TAKE ONE TABLET BY MOUTH TWICE DAILY BEFORE MEAL(S) 180 tablet 1  . Insulin Glargine (LANTUS SOLOSTAR) 100 UNIT/ML Solostar Pen Inject 30 Units into the skin daily at 10 pm. 45 mL 3  . levothyroxine (SYNTHROID, LEVOTHROID) 112 MCG tablet TAKE ONE TABLET BY MOUTH ONCE DAILY BEFORE BREAKFAST 30 tablet 3  . lisinopril (PRINIVIL,ZESTRIL) 2.5 MG tablet Take 1 tablet (2.5 mg total) by mouth daily. 30 tablet 3  . lovastatin (MEVACOR) 20 MG tablet Take 1 tablet (20 mg total) by mouth at bedtime. 30 tablet 3  . metFORMIN (GLUCOPHAGE XR) 500 MG 24 hr tablet Take 3 tablets in the morning and 2 tablets in the evening by mouth with food 150 tablet 5  . ULTICARE MICRO PEN NEEDLES 32G X 4 MM MISC USE TO GIVE INSULIN ONCE PER NIGHT 100 each 12   No facility-administered medications prior to visit.     ROS Review of  Systems General: negative for fever, weight loss, appetite change, positive for fatigue Eyes: no visual symptoms. ENT: no ear symptoms, no sinus tenderness, no nasal congestion or sore throat. Neck: no pain  Respiratory: no wheezing, shortness of breath, cough Cardiovascular: no chest pain, no dyspnea on exertion, no pedal edema, no orthopnea. Gastrointestinal: no abdominal pain, no diarrhea, no constipation Genito-Urinary: no urinary frequency, no dysuria, no polyuria. Hematologic: no bruising Endocrine: no cold or heat intolerance Neurological: no headaches, no seizures, no tremors Musculoskeletal: no joint pains, no joint swelling Skin: no pruritus, no rash. Psychological: no depression, no anxiety,    Objective:  BP (!) 142/80   Pulse 68   Temp 98.1 F (36.7 C) (Oral)   Ht _0  (1.778 m)   Wt 175 lb 9.6 oz (79.7 kg)   SpO2 98%   BMI 25.20 kg/m   BP/Weight 12/14/2016 08/17/2016 05/16/6977  Systolic BP 480 165 537  Diastolic BP 80 70 77  Wt. (Lbs) 175.6 171.8 171.6  BMI 25.2 24.65 24.62      Physical Exam Constitutional: normal appearing,  Eyes: PERRLA HEENT: Head is atraumatic, normal sinuses, normal oropharynx, normal appearing tonsils and palate, tympanic membrane is normal bilaterally. Neck: normal range of motion, no thyromegaly, no JVD Cardiovascular: normal rate and rhythm, normal heart sounds, no murmurs, rub or gallop, no pedal edema Respiratory: clear to auscultation bilaterally, no wheezes, no rales, no rhonchi Abdomen:  soft, not tender to palpation, normal bowel sounds, no enlarged organs Extremities: Full ROM, no tenderness in joints Skin: warm and dry, no lesions. Neurological: alert, oriented x3, cranial nerves I-XII grossly intact , normal motor strength, normal sensation. Psychological: normal mood.   CMP Latest Ref Rng & Units 08/17/2016 05/05/2016 01/20/2016  Glucose 65 - 99 mg/dL 200(H) 96 211(H)  BUN 6 - 24 mg/dL _0 Creatinine 0.76 - 1.27  mg/dL 1.14 0.98 1.19  Sodium 134 - 144 mmol/L 143 143 140  Potassium 3.5 - 5.2 mmol/L 4.5 4.5 4.5  Chloride 96 - 106 mmol/L 105 101 104  CO2 20 - 29 mmol/L _1 Calcium 8.7 - 10.2 mg/dL 9.1 9.9 9.4  Total Protein 6.0 - 8.5 g/dL 6.4 7.1 6.8  Total Bilirubin 0.0 - 1.2 mg/dL 0.3 0.3 0.4  Alkaline Phos 39 - 117 IU/L 83 72 71  AST 0 - 40 IU/L 35 26 20  ALT 0 - 44 IU/L _2 Lipid Panel     Component Value Date/Time   CHOL 131 08/17/2016 0958   TRIG 71 08/17/2016 0958   HDL 38 (L) 08/17/2016 0958   CHOLHDL 3.4 08/17/2016 0958   CHOLHDL 3.6 01/20/2016 0831   VLDL 14 02/21/2015 0900   LDLCALC 79 08/17/2016 0958   LDLDIRECT 115 (H) 04/30/2010 1052    Lab Results  Component Value Date   HGBA1C 8.4 08/17/2016     .  Assessment & Plan:   1. Type 2 diabetes mellitus without complication, with long-term current use of insulin (HCC) Last A1c was 8.4 We will send out an A1c and adjust regimen accordingly Diabetic diet - POCT glucose (manual entry) - metFORMIN (GLUCOPHAGE XR) 500 MG 24 hr tablet; Take 3 tablets in the morning and 2 tablets in the evening by mouth with food  Dispense: 150 tablet; Refill: 5 - Insulin Glargine (LANTUS SOLOSTAR) 100 UNIT/ML Solostar Pen; Inject 30 Units into the skin daily at 10 pm.  Dispense: 45 mL; Refill: 5 - glipiZIDE (GLUCOTROL) 10 MG tablet; TAKE ONE TABLET BY MOUTH TWICE DAILY BEFORE MEAL(S)  Dispense: 180 tablet; Refill: 1 - Insulin Pen Needle (ULTICARE MICRO PEN NEEDLES) 32G X 4 MM MISC; USE TO GIVE INSULIN ONCE PER NIGHT  Dispense: 100 each; Refill: 12 - Hemoglobin A1c  2. Pure hypercholesterolemia LDL is 115 which is above goal of less than 100 Low-cholesterol diet and if still elevated will adjust dose of statin - lovastatin (MEVACOR) 20 MG tablet; Take 1 tablet (20 mg total) by mouth at bedtime.  Dispense: 30 tablet; Refill: 5 - CMP14+EGFR  3. HYPERTENSION, BENIGN ESSENTIAL Slightly elevated above goal of less than 130/80 No  regimen change - lisinopril (PRINIVIL,ZESTRIL) 2.5 MG tablet; Take 1 tablet (2.5 mg total) by mouth daily.  Dispense: 30 tablet; Refill: 5  4. Acquired hypothyroidism Controlled - levothyroxine (SYNTHROID, LEVOTHROID) 112 MCG tablet; TAKE ONE TABLET BY MOUTH ONCE DAILY BEFORE BREAKFAST  Dispense: 30 tablet; Refill: 5 - TSH - T4, free  5. Other fatigue Unknown etiology - CBC with Differential/Platelet - VITAMIN D 25 Hydroxy (Vit-D Deficiency, Fractures) - Testosterone,Free and Total  6. Need for Tdap vaccination Tdap administered   Meds ordered this encounter  Medications  . metFORMIN (GLUCOPHAGE XR) 500 MG 24 hr tablet    Sig: Take 3 tablets in the morning and 2 tablets in the evening by mouth with food    Dispense:  150 tablet  Refill:  5  . DISCONTD: Insulin Pen Needle (ULTICARE MICRO PEN NEEDLES) 32G X 4 MM MISC    Sig: USE TO GIVE INSULIN ONCE PER NIGHT    Dispense:  100 each    Refill:  12  . lovastatin (MEVACOR) 20 MG tablet    Sig: Take 1 tablet (20 mg total) by mouth at bedtime.    Dispense:  30 tablet    Refill:  5  . lisinopril (PRINIVIL,ZESTRIL) 2.5 MG tablet    Sig: Take 1 tablet (2.5 mg total) by mouth daily.    Dispense:  30 tablet    Refill:  5  . Insulin Glargine (LANTUS SOLOSTAR) 100 UNIT/ML Solostar Pen    Sig: Inject 30 Units into the skin daily at 10 pm.    Dispense:  45 mL    Refill:  5    Discontinue previous dose  . levothyroxine (SYNTHROID, LEVOTHROID) 112 MCG tablet    Sig: TAKE ONE TABLET BY MOUTH ONCE DAILY BEFORE BREAKFAST    Dispense:  30 tablet    Refill:  5  . glipiZIDE (GLUCOTROL) 10 MG tablet    Sig: TAKE ONE TABLET BY MOUTH TWICE DAILY BEFORE MEAL(S)    Dispense:  180 tablet    Refill:  1  . DISCONTD: Insulin Pen Needle (ULTICARE MICRO PEN NEEDLES) 32G X 4 MM MISC    Sig: USE TO GIVE INSULIN ONCE PER NIGHT    Dispense:  100 each    Refill:  12  . Insulin Pen Needle (ULTICARE MICRO PEN NEEDLES) 32G X 4 MM MISC    Sig: USE TO  GIVE INSULIN ONCE PER NIGHT    Dispense:  100 each    Refill:  12    Follow-up: Return in about 3 months (around 03/14/2017) for follow up on Diabetes mellitus.   Arnoldo Morale MD

## 2016-12-15 ENCOUNTER — Other Ambulatory Visit: Payer: Self-pay | Admitting: Pharmacist

## 2016-12-15 ENCOUNTER — Other Ambulatory Visit: Payer: Self-pay | Admitting: Family Medicine

## 2016-12-15 DIAGNOSIS — E119 Type 2 diabetes mellitus without complications: Secondary | ICD-10-CM

## 2016-12-15 DIAGNOSIS — Z794 Long term (current) use of insulin: Principal | ICD-10-CM

## 2016-12-15 LAB — CMP14+EGFR
A/G RATIO: 1.8 (ref 1.2–2.2)
ALK PHOS: 73 IU/L (ref 39–117)
ALT: 40 IU/L (ref 0–44)
AST: 34 IU/L (ref 0–40)
Albumin: 4.4 g/dL (ref 3.5–5.5)
BUN/Creatinine Ratio: 12 (ref 9–20)
BUN: 14 mg/dL (ref 6–24)
Bilirubin Total: 0.4 mg/dL (ref 0.0–1.2)
CALCIUM: 9.7 mg/dL (ref 8.7–10.2)
CO2: 24 mmol/L (ref 20–29)
Chloride: 103 mmol/L (ref 96–106)
Creatinine, Ser: 1.18 mg/dL (ref 0.76–1.27)
GFR calc Af Amer: 83 mL/min/{1.73_m2} (ref >59)
GFR, EST NON AFRICAN AMERICAN: 72 mL/min/{1.73_m2} (ref >59)
Globulin, Total: 2.5 g/dL (ref 1.5–4.5)
Glucose: 95 mg/dL (ref 65–99)
POTASSIUM: 4.6 mmol/L (ref 3.5–5.2)
SODIUM: 143 mmol/L (ref 134–144)
Total Protein: 6.9 g/dL (ref 6.0–8.5)

## 2016-12-15 LAB — CBC WITH DIFFERENTIAL/PLATELET
BASOS ABS: 0 10*3/uL (ref 0.0–0.2)
BASOS: 1 %
EOS (ABSOLUTE): 0.2 10*3/uL (ref 0.0–0.4)
Eos: 6 %
Hematocrit: 43.2 % (ref 37.5–51.0)
Hemoglobin: 14.6 g/dL (ref 13.0–17.7)
IMMATURE GRANS (ABS): 0 10*3/uL (ref 0.0–0.1)
IMMATURE GRANULOCYTES: 0 %
LYMPHS: 39 %
Lymphocytes Absolute: 1.5 10*3/uL (ref 0.7–3.1)
MCH: 30.7 pg (ref 26.6–33.0)
MCHC: 33.8 g/dL (ref 31.5–35.7)
MCV: 91 fL (ref 79–97)
MONOS ABS: 0.2 10*3/uL (ref 0.1–0.9)
Monocytes: 6 %
NEUTROS PCT: 48 %
Neutrophils Absolute: 1.8 10*3/uL (ref 1.4–7.0)
PLATELETS: 282 10*3/uL (ref 150–379)
RBC: 4.75 x10E6/uL (ref 4.14–5.80)
RDW: 14.3 % (ref 12.3–15.4)
WBC: 3.8 10*3/uL (ref 3.4–10.8)

## 2016-12-15 LAB — T4, FREE: FREE T4: 1.43 ng/dL (ref 0.82–1.77)

## 2016-12-15 LAB — HEMOGLOBIN A1C
Est. average glucose Bld gHb Est-mCnc: 189 mg/dL
HEMOGLOBIN A1C: 8.2 % — AB (ref 4.8–5.6)

## 2016-12-15 LAB — TSH: TSH: 0.513 u[IU]/mL (ref 0.450–4.500)

## 2016-12-15 LAB — TESTOSTERONE,FREE AND TOTAL
TESTOSTERONE FREE: 11 pg/mL (ref 6.8–21.5)
TESTOSTERONE: 497 ng/dL (ref 264–916)

## 2016-12-15 LAB — VITAMIN D 25 HYDROXY (VIT D DEFICIENCY, FRACTURES): VIT D 25 HYDROXY: 33.1 ng/mL (ref 30.0–100.0)

## 2016-12-15 MED ORDER — INSULIN GLARGINE 100 UNIT/ML SOLOSTAR PEN: 35.0000 [IU] | PEN_INJECTOR | Freq: Every day | SUBCUTANEOUS | 5 refills | Status: DC

## 2016-12-15 MED ORDER — INSULIN PEN NEEDLE 31G X 5 MM MISC: 0 refills | Status: AC

## 2016-12-16 ENCOUNTER — Telehealth: Payer: Self-pay

## 2016-12-16 NOTE — Telephone Encounter (Signed)
Pt was called and informed of lab results. 

## 2017-01-07 ENCOUNTER — Encounter (HOSPITAL_BASED_OUTPATIENT_CLINIC_OR_DEPARTMENT_OTHER): Payer: Self-pay

## 2017-01-07 ENCOUNTER — Emergency Department (HOSPITAL_BASED_OUTPATIENT_CLINIC_OR_DEPARTMENT_OTHER)
Admission: EM | Admit: 2017-01-07 | Discharge: 2017-01-07 | Disposition: A | Discharge status: Home / Self Care | Payer: Self-pay | Attending: Physician Assistant | Admitting: Physician Assistant

## 2017-01-07 ENCOUNTER — Other Ambulatory Visit: Payer: Self-pay

## 2017-01-07 DIAGNOSIS — E1140 Type 2 diabetes mellitus with diabetic neuropathy, unspecified: Secondary | ICD-10-CM | POA: Insufficient documentation

## 2017-01-07 DIAGNOSIS — E039 Hypothyroidism, unspecified: Secondary | ICD-10-CM | POA: Insufficient documentation

## 2017-01-07 DIAGNOSIS — Z794 Long term (current) use of insulin: Secondary | ICD-10-CM | POA: Insufficient documentation

## 2017-01-07 DIAGNOSIS — M25511 Pain in right shoulder: Secondary | ICD-10-CM | POA: Insufficient documentation

## 2017-01-07 DIAGNOSIS — I10 Essential (primary) hypertension: Secondary | ICD-10-CM | POA: Insufficient documentation

## 2017-01-07 HISTORY — DX: Disorder of thyroid, unspecified: E07.9

## 2017-01-07 MED ORDER — IBUPROFEN 400 MG PO TABS
600.0000 mg | ORAL_TABLET | Freq: Once | ORAL | Status: AC
  Administered 2017-01-07: 23:00:00 600 mg via ORAL
  Filled 2017-01-07: qty 1

## 2017-01-07 NOTE — ED Triage Notes (Addendum)
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

## 2017-01-07 NOTE — Discharge Instructions (Signed)
Please read and follow all provided instructions.  You have been seen today for right shoulder pain  Tests performed today include: Vital signs. See below for your results today.   Home care instructions: -- *PRICE in the first 24-48 hours after injury: Protect (with brace, splint, sling), if given by your provider -please take your arm out of the shoulder sling at least once per day and perform shoulder range of motion exercises in order to prevent frozen shoulder Rest Ice- Do not apply ice pack directly to your skin, place towel or similar between your skin and ice/ice pack. Apply ice for 20 min, then remove for 40 min while awake Compression- Wear brace, elastic bandage, splint as directed by your provider Elevate affected extremity above the level of your heart when not walking around for the first 24-48 hours   Use Ibuprofen (Motrin/Advil) 600mg  every 6 hours as needed for pain (do not exceed max dose in 24 hours, 2400mg )  Follow-up instructions: Please follow-up with your primary care provider or the provided orthopedic physician (bone specialist) if you continue to have significant pain in 1 week. In this case you may have a more severe injury that requires further care.   Return instructions:  Please return if your toes or feet are numb or tingling, appear gray or blue, or you have severe pain (also elevate the leg and loosen splint or wrap if you were given one) Please return to the Emergency Department if you experience worsening symptoms.  Please return if you have any other emergent concerns. Additional Information:  Your vital signs today were: BP 129/86 (BP Location: Left Arm)    Pulse 92    Temp 98.6 F (37 C) (Oral)    Resp 16    Ht 5\' 10"  (1.778 m)    Wt 78.2 kg (172 lb 4.8 oz)    SpO2 99%    BMI 24.72 kg/m  If your blood pressure (BP) was elevated above 135/85 this visit, please have this repeated by your doctor within one month. ---------------

## 2017-01-07 NOTE — ED Provider Notes (Signed)
[facility] EMERGENCY DEPARTMENT Provider Note   CSN: [id number] Arrival date & time: 01/07/17  1950     History   Chief Complaint Chief Complaint  Patient presents with  . Arm Pain    HPI Ricardo Forbes is a 51 y.o. male history of diabetes who presents to the emergency department today for right shoulder pain that began today.  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  Today he noticed that his right shoulder has been giving him pain with movement.  He has not taken anything for this.  Patient denies any previous injury.  He denies any paresthesias or weakness associated with this.  HPI  Past Medical History:  Diagnosis Date  . Diabetes mellitus without complication (HCC)   . Thyroid disease     Patient Active Problem List   Diagnosis Date Noted  . Erectile dysfunction associated with type 2 diabetes mellitus (HCC) 05/30/2010  . ABSCESS, TOOTH 12/04/2009  . Diabetes ([facility]CC) 06/14/2009  . Hyperlipidemia 02/11/2009  . Hypothyroidism 02/01/2009  . Diabetes mellitus ([facility]CC) 02/01/2009  . PERIPHERAL NEUROPATHY 02/01/2009  . BORDERLINE GLAUCOMA WITH OCULAR HYPERTENSION 02/01/2009  . HYPERTENSION, BENIGN ESSENTIAL 02/01/2009  . UNSPECIFIED DENTAL CARIES 02/01/2009    History reviewed. No pertinent surgical history.     Home Medications    Prior to Admission medications   Medication Sig Start Date End Date Taking? Authorizing Provider  glipiZIDE (GLUCOTROL) 10 MG tablet TAKE ONE TABLET BY MOUTH TWICE DAILY BEFORE MEAL(S) 12/14/16   Jaclyn ShaggyAmao, Enobong, MD  Insulin Glargine (LANTUS SOLOSTAR) 100 UNIT/ML Solostar Pen Inject 35 Units into the skin daily at 10 pm. 12/15/16   Jaclyn ShaggyAmao, Enobong, MD  Insulin Pen Needle (B-D UF III MINI PEN NEEDLES) 31G X 5 MM MISC Use as directed once daily 12/15/16   Jaclyn ShaggyAmao, Enobong, MD  levothyroxine (SYNTHROID, LEVOTHROID) 112 MCG tablet TAKE ONE TABLET BY MOUTH ONCE  DAILY BEFORE BREAKFAST 12/14/16   Jaclyn ShaggyAmao, Enobong, MD  lisinopril (PRINIVIL,ZESTRIL) 2.5 MG tablet Take 1 tablet (2.5 mg total) by mouth daily. 12/14/16   Jaclyn ShaggyAmao, Enobong, MD  lovastatin (MEVACOR) 20 MG tablet Take 1 tablet (20 mg total) by mouth at bedtime. 12/14/16   Jaclyn ShaggyAmao, Enobong, MD  metFORMIN (GLUCOPHAGE XR) 500 MG 24 hr tablet Take 3 tablets in the morning and 2 tablets in the evening by mouth with food 12/14/16   Jaclyn ShaggyAmao, Enobong, MD    Family History No family history on file.  Social History Social History   Tobacco Use  . Smoking status: Never Smoker  . Smokeless tobacco: Never Used  Substance Use Topics  . Alcohol use: No    Frequency: Never  . Drug use: No     Allergies   Patient has no known allergies.   Review of Systems Review of Systems  Constitutional: Negative for chills and fever.  Musculoskeletal: Positive for arthralgias. Negative for joint swelling.  Skin: Negative for color change and wound.  Neurological: Negative for numbness.     Physical Exam Updated Vital Signs BP 129/86 (BP Location: Left Arm)   Pulse 92   Temp 98.6 F (37 C) (Oral)   Resp 16   Ht 5\' 10"  (1.778 m)   Wt 78.2 kg (172 lb 4.8 oz)   SpO2 99%   BMI 24.72 kg/m   Physical Exam  Constitutional: He appears well-developed and well-nourished.  HENT:  Head: Normocephalic and atraumatic.  Right Ear: External ear  normal.  Left Ear: External ear normal.  Eyes: Conjunctivae are normal. Right eye exhibits no discharge. Left eye exhibits no discharge. No scleral icterus.  Pulmonary/Chest: Effort normal. No respiratory distress.  Musculoskeletal:  Cervical Spine: Appearance normal. No obvious bony deformity. No skin swelling, erythema, heat, fluctuance or break of the skin. No TTP over the cervical spinous processes. No paraspinal tenderness. No step-offs. Patient is able to actively rotate their neck 45 degrees left and right voluntarily without pain and flex and extend the neck without pain.  Negative Spurling's and Cervical Load test.  Right Shoulder: Appearance normal. No obvious bony deformity. No skin swelling, erythema, heat, fluctuance or break of the skin. No clavicular deformity or TTP. TTP over superior scapular border and posterior shoulder. Patient with pain noted for passive flexion and abduction. Intact passive extension,adduction, and internal/external rotation without pain or crepitus. Flexion limited to 150 degree's. Strength for flexion, extension, abduction, adduction, and internal/external rotation intact and appropriate for age. Positive Hawkin's and Neer's test. Negative Lift off test. +/- speed's test.   Right Elbow: Appearance normal. No obvious bony deformity. No skin swelling, erythema, heat, fluctuance or break of the skin. No TTP over joint. Active flexion, extension, supination and pronation full and intact without pain. Strength able and appropriate for age for flexion and extension.  Radial Pulse 2+. Cap refill <2 seconds. SILT for M/U/R distributions. Compartments soft.    Neurological: He is alert.  Skin: Skin is warm and dry. No erythema. No pallor.  Psychiatric: He has a normal mood and affect.  Nursing note and vitals reviewed.    ED Treatments / Results  Labs (all labs ordered are listed, but only abnormal results are displayed) Labs Reviewed - No data to display  EKG  EKG Interpretation None       Radiology No results found.  Procedures Procedures (including critical care time)  Medications Ordered in ED Medications - No data to display   Initial Impression / Assessment and Plan / ED Course  I have reviewed the triage vital signs and the nursing notes.  Pertinent labs & imaging results that were available during my care of the patient were reviewed by me and considered in my medical decision making (see chart for details).     51 year old male presenting with right shoulder pain after repetitive motions including lifting wine  overhead while at work.  Exam is consistent with shoulder impingement versus rotator cuff/biceps tendinitis.  I do not feel x-ray is needed at this time.  Is without fever, skin erythema or joint swelling.  Range of motion intact.  Exam is not consistent with a septic joint.  Will treat the patient with shoulder sling and Price therapy.  Will advise the patient to take his arm out of the shoulder sling several times per day and perform shoulder range of motion exercises in order to prevent frozen shoulder.  Stressed the patient that this is important as he is a diabetic and at increased risk for this.  Patient previous lab work from earlier this year shows good kidney function.  Will advise NSAIDs as needed for pain.  Patient is to follow with PCP versus orthopedist in 1 week for this.  Return precautions discussed.  Patient appears safe for discharge.  Final Clinical Impressions(s) / ED Diagnoses   Final diagnoses:  Acute pain of right shoulder    ED Discharge Orders    None       Jacinto HalimMaczis, Briani Maul M, Cordelia Poche-C 01/07/17 2245  Abelino Derrick, MD 01/13/17 0006

## 2017-01-24 ENCOUNTER — Other Ambulatory Visit: Payer: Self-pay | Admitting: Family Medicine

## 2017-01-24 DIAGNOSIS — I10 Essential (primary) hypertension: Secondary | ICD-10-CM

## 2017-02-10 MED FILL — $LANTUS SOLOSTAR 100 UNITS/: 100 | 30 days supply | Qty: 9 | Fill #2

## 2017-03-15 ENCOUNTER — Encounter: Payer: Self-pay | Admitting: Family Medicine

## 2017-03-15 ENCOUNTER — Ambulatory Visit: Payer: Self-pay | Attending: Family Medicine | Admitting: Family Medicine

## 2017-03-15 VITALS — BP 132/77 | HR 92 | Temp 98.0°F | Ht 70.0 in | Wt 175.0 lb

## 2017-03-15 DIAGNOSIS — E119 Type 2 diabetes mellitus without complications: Secondary | ICD-10-CM

## 2017-03-15 DIAGNOSIS — Z794 Long term (current) use of insulin: Secondary | ICD-10-CM

## 2017-03-15 DIAGNOSIS — E039 Hypothyroidism, unspecified: Secondary | ICD-10-CM

## 2017-03-15 DIAGNOSIS — I10 Essential (primary) hypertension: Secondary | ICD-10-CM

## 2017-03-15 DIAGNOSIS — E7800 Pure hypercholesterolemia, unspecified: Secondary | ICD-10-CM

## 2017-03-15 DIAGNOSIS — Z79899 Other long term (current) drug therapy: Secondary | ICD-10-CM | POA: Insufficient documentation

## 2017-03-15 LAB — POCT GLYCOSYLATED HEMOGLOBIN (HGB A1C): Hemoglobin A1C: 8.1

## 2017-03-15 LAB — GLUCOSE, POCT (MANUAL RESULT ENTRY): POC Glucose: 189 mg/dl — AB (ref 70–99)

## 2017-03-15 MED ORDER — TRUEPLUS LANCETS 28G MISC: 1.0000 | Freq: Three times a day (TID) | 12 refills | Status: DC

## 2017-03-15 MED ORDER — TRUEPLUS LANCETS 28G MISC
1.0000 | Freq: Three times a day (TID) | 12 refills | Status: AC
Start: 2017-03-15 — End: ?

## 2017-03-15 MED ORDER — GLUCOSE BLOOD VI STRP: ORAL_STRIP | 12 refills | Status: DC

## 2017-03-15 MED ORDER — LEVOTHYROXINE SODIUM 112 MCG PO TABS: ORAL_TABLET | ORAL | 5 refills | Status: DC

## 2017-03-15 MED ORDER — GLIPIZIDE 10 MG PO TABS: ORAL_TABLET | ORAL | 1 refills | Status: DC

## 2017-03-15 MED ORDER — INSULIN GLARGINE 100 UNIT/ML SOLOSTAR PEN: 35.0000 [IU] | PEN_INJECTOR | Freq: Every day | SUBCUTANEOUS | 5 refills | Status: DC

## 2017-03-15 MED ORDER — METFORMIN HCL ER 500 MG PO TB24: ORAL_TABLET | ORAL | 5 refills | Status: DC

## 2017-03-15 MED ORDER — LOVASTATIN 20 MG PO TABS: 20.0000 mg | ORAL_TABLET | Freq: Every day | ORAL | 5 refills | Status: DC

## 2017-03-15 MED ORDER — TRUE METRIX METER DEVI: 1.0000 | Freq: Three times a day (TID) | 0 refills | Status: DC

## 2017-03-15 MED ORDER — LISINOPRIL 2.5 MG PO TABS: 2.5000 mg | ORAL_TABLET | Freq: Every day | ORAL | 5 refills | Status: DC

## 2017-03-15 NOTE — Patient Instructions (Signed)

## 2017-03-15 NOTE — Progress Notes (Signed)
Subjective:  Patient ID: Ricardo Forbes, male    DOB: [birth year]  Age: 52 y.o. MRN: [id number]  CC: Diabetes   HPI Ricardo Forbes is a 52 year old male with a history of type 2 diabetes mellitus diagnosed in 11/2003 (A1c 8.1 ), hyperlipidemia, hypothyroidism who comes into the clinic for a follow-up visit.   His A1c is 8.1 and has decreased from 8.4 previously.  He endorses compliance with his Lantus, metformin and glipizide and endorses one single episode of hypoglycemia of 74 which occurred this morning but he attributes this to having an early dinner yesterday.  Denies numbness in extremities or visual concerns. His last eye exam was in 03/2016 with [facility] and he has an upcoming appointment for one next month. He tolerates his statin and denies myalgias or other side effects. Doing well on  levothyroxine. He has no acute concerns at this time.  Past Medical History:  Diagnosis Date  . Diabetes mellitus without complication (HCC)   . Thyroid disease     History reviewed. No pertinent surgical history.  No Known Allergies   Outpatient Medications Prior to Visit  Medication Sig Dispense Refill  . Insulin Pen Needle (B-D UF III MINI PEN NEEDLES) 31G X 5 MM MISC Use as directed once daily 100 each 0  . glipiZIDE (GLUCOTROL) 10 MG tablet TAKE ONE TABLET BY MOUTH TWICE DAILY BEFORE MEAL(S) 180 tablet 1  . Insulin Glargine (LANTUS SOLOSTAR) 100 UNIT/ML Solostar Pen Inject 35 Units into the skin daily at 10 pm. 45 mL 5  . levothyroxine (SYNTHROID, LEVOTHROID) 112 MCG tablet TAKE ONE TABLET BY MOUTH ONCE DAILY BEFORE BREAKFAST 30 tablet 5  . lisinopril (PRINIVIL,ZESTRIL) 2.5 MG tablet Take 1 tablet (2.5 mg total) by mouth daily. 30 tablet 5  . lovastatin (MEVACOR) 20 MG tablet Take 1 tablet (20 mg total) by mouth at bedtime. 30 tablet 5  . metFORMIN (GLUCOPHAGE XR) 500 MG 24 hr tablet Take 3 tablets in the morning and 2 tablets in the evening by mouth with food 150 tablet 5    No facility-administered medications prior to visit.     ROS Review of Systems  Constitutional: Negative for activity change and appetite change.  HENT: Negative for sinus pressure and sore throat.   Eyes: Negative for visual disturbance.  Respiratory: Negative for cough, chest tightness and shortness of breath.   Cardiovascular: Negative for chest pain and leg swelling.  Gastrointestinal: Negative for abdominal distention, abdominal pain, constipation and diarrhea.  Endocrine: Negative.   Genitourinary: Negative for dysuria.  Musculoskeletal: Negative for joint swelling and myalgias.  Skin: Negative for rash.  Allergic/Immunologic: Negative.   Neurological: Negative for weakness, light-headedness and numbness.  Psychiatric/Behavioral: Negative for dysphoric mood and suicidal ideas.    Objective:  BP 132/77   Pulse 92   Temp 98 F (36.7 C) (Oral)   Ht 5\' 10"  (1.778 m)   Wt 175 lb (79.4 kg)   SpO2 99%   BMI 25.11 kg/m   BP/Weight 03/15/2017 01/07/2017 12/14/2016  Systolic BP 132 129 142  Diastolic BP 77 86 80  Wt. (Lbs) 175 172.3 175.6  BMI 25.11 24.72 25.2      Physical Exam  Constitutional: He is oriented to person, place, and time. He appears well-developed and well-nourished.  Cardiovascular: Normal rate, normal heart sounds and intact distal pulses.  No murmur heard. Pulmonary/Chest: Effort normal and breath sounds normal. He has no wheezes. He has no rales. He exhibits no tenderness.  Abdominal: Soft. Bowel  sounds are normal. He exhibits no distension and no mass. There is no tenderness.  Musculoskeletal: Normal range of motion.  Neurological: He is alert and oriented to person, place, and time.  Skin: Skin is warm and dry.  Psychiatric: He has a normal mood and affect.     CMP Latest Ref Rng & Units 12/14/2016 08/17/2016 05/05/2016  Glucose 65 - 99 mg/dL 95 409(W) 96  BUN 6 - 24 mg/dL 14 10 14   Creatinine 0.76 - 1.27 mg/dL 1.19 1.47 8.29  Sodium 134 - 144  mmol/L 143 143 143  Potassium 3.5 - 5.2 mmol/L 4.6 4.5 4.5  Chloride 96 - 106 mmol/L 103 105 101  CO2 20 - 29 mmol/L 24 22 26   Calcium 8.7 - 10.2 mg/dL 9.7 9.1 9.9  Total Protein 6.0 - 8.5 g/dL 6.9 6.4 7.1  Total Bilirubin 0.0 - 1.2 mg/dL 0.4 0.3 0.3  Alkaline Phos 39 - 117 IU/L 73 83 72  AST 0 - 40 IU/L 34 35 26  ALT 0 - 44 IU/L 40 22 29    Lipid Panel     Component Value Date/Time   CHOL 131 08/17/2016 0958   TRIG 71 08/17/2016 0958   HDL 38 (L) 08/17/2016 0958   CHOLHDL 3.4 08/17/2016 0958   CHOLHDL 3.6 01/20/2016 0831   VLDL 14 02/21/2015 0900   LDLCALC 79 08/17/2016 0958   LDLDIRECT 115 (H) 04/30/2010 1052    Lab Results  Component Value Date   HGBA1C 8.1 03/15/2017   .   Assessment & Plan:   1. 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 It is recommended that you undergo annual eye exams and annual foot exams -upcoming eye appointment next month Pneumonia vaccine is recommended. - POCT glucose (manual entry) - POCT glycosylated hemoglobin (Hb A1C) - metFORMIN (GLUCOPHAGE XR) 500 MG 24 hr tablet; Take 3 tablets in the morning and 2 tablets in the evening by mouth with food  Dispense: 150 tablet; Refill: 5 - glipiZIDE (GLUCOTROL) 10 MG tablet; TAKE ONE TABLET BY MOUTH TWICE DAILY BEFORE MEAL(S)  Dispense: 180 tablet; Refill: 1 - Insulin Glargine (LANTUS SOLOSTAR) 100 UNIT/ML Solostar Pen; Inject 35 Units into the skin daily at 10 pm.  Dispense: 45 mL; Refill: 5 - TRUEPLUS  LANCETS 28G MISC; 1 each by Does not apply route 3 (three) times daily before meals.  Dispense: 100 each; Refill: 12 - Blood Glucose Monitoring Suppl (TRUE METRIX METER) DEVI; 1 each by Does not apply route 3 (three) times daily before meals.  Dispense: 1 Device; Refill: 0 - glucose blood (TRUE METRIX BLOOD GLUCOSE TEST) test strip; Use 3 times daily before meals  Dispense: 100 each; Refill: 12  2. Pure hypercholesterolemia Controlled Low-cholesterol diet - lovastatin (MEVACOR) 20 MG tablet; Take 1 tablet (20 mg total) by mouth at bedtime.  Dispense: 30 tablet; Refill: 5  3. HYPERTENSION, BENIGN ESSENTIAL Controlled Counseled on blood pressure goal of less than 130/80, low-sodium, DASH diet, medication compliance, 150 minutes of moderate  intensity exercise per week. Discussed medication compliance, adverse effects. - lisinopril (PRINIVIL,ZESTRIL) 2.5 MG tablet; Take 1 tablet (2.5 mg total) by mouth daily.  Dispense: 30 tablet; Refill: 5  4. Acquired hypothyroidism Controlled - levothyroxine (SYNTHROID, LEVOTHROID) 112 MCG tablet; TAKE ONE TABLET BY MOUTH ONCE DAILY BEFORE BREAKFAST  Dispense: 30 tablet; Refill: 5   Meds ordered this encounter  Medications  . DISCONTD: Insulin Glargine (LANTUS SOLOSTAR) 100 UNIT/ML Solostar Pen    Sig: Inject 35 Units into the skin daily at 10 pm.    Dispense:  45 mL    Refill:  5    Discontinue previous dose  . lovastatin (MEVACOR) 20 MG tablet    Sig: Take 1 tablet (20 mg total) by mouth at bedtime.    Dispense:  30 tablet    Refill:  5  . lisinopril (PRINIVIL,ZESTRIL) 2.5 MG tablet    Sig: Take 1 tablet (2.5 mg total) by mouth daily.    Dispense:  30 tablet    Refill:  5  . metFORMIN (GLUCOPHAGE XR) 500 MG 24 hr tablet    Sig: Take 3 tablets in the morning and 2 tablets in the evening by mouth with food    Dispense:  150 tablet    Refill:  5  . levothyroxine (SYNTHROID, LEVOTHROID) 112 MCG tablet    Sig: TAKE ONE TABLET BY MOUTH ONCE DAILY  BEFORE BREAKFAST    Dispense:  30 tablet    Refill:  5  . glipiZIDE (GLUCOTROL) 10 MG tablet    Sig: TAKE ONE TABLET BY MOUTH TWICE DAILY BEFORE MEAL(S)    Dispense:  180 tablet    Refill:  1  . DISCONTD: glucose blood (TRUE METRIX BLOOD GLUCOSE TEST) test strip    Sig: Use 3 times daily before meals    Dispense:  100 each    Refill:  12  . DISCONTD: Blood Glucose Monitoring Suppl (TRUE METRIX METER) DEVI    Sig: 1 each by Does not apply route 3 (three) times daily before meals.    Dispense:  1 Device    Refill:  0  . DISCONTD: TRUEPLUS LANCETS 28G MISC    Sig: 1 each by Does not apply route 3 (three) times daily before meals.    Dispense:  100 each    Refill:  12  . Insulin Glargine (LANTUS SOLOSTAR) 100 UNIT/ML Solostar Pen    Sig: Inject 35 Units into the skin daily at 10 pm.    Dispense:  45 mL    Refill:  5    Discontinue previous dose  . TRUEPLUS LANCETS 28G MISC    Sig: 1 each by Does not apply route 3 (three) times daily before meals.    Dispense:  100 each    Refill:  12  . Blood Glucose Monitoring Suppl (TRUE METRIX METER) DEVI    Sig: 1 each by Does not apply route 3 (three) times daily before meals.    Dispense:  1 Device    Refill:  0  . glucose blood (TRUE METRIX BLOOD GLUCOSE TEST) test strip    Sig: Use 3 times daily before meals    Dispense:  100 each    Refill:  12    Follow-up: Return in about 3 months (around 06/15/2017) for Follow-up of chronic medical conditions.   Hoy RegisterEnobong Krystol Rocco MD

## 2017-06-21 ENCOUNTER — Ambulatory Visit: Payer: Self-pay | Admitting: Family Medicine

## 2017-07-19 LAB — HM DIABETES EYE EXAM

## 2017-08-05 MED FILL — $LANTUS SOLOSTAR 100 UNITS/: 100 | 25 days supply | Qty: 9 | Fill #0

## 2017-08-09 ENCOUNTER — Encounter: Payer: Self-pay | Admitting: Family Medicine

## 2017-08-09 ENCOUNTER — Ambulatory Visit: Payer: Self-pay | Attending: Family Medicine | Admitting: Family Medicine

## 2017-08-09 VITALS — BP 135/80 | HR 67 | Temp 97.9°F | Ht 70.0 in | Wt 171.6 lb

## 2017-08-09 DIAGNOSIS — Z79899 Other long term (current) drug therapy: Secondary | ICD-10-CM | POA: Insufficient documentation

## 2017-08-09 DIAGNOSIS — E7800 Pure hypercholesterolemia, unspecified: Secondary | ICD-10-CM | POA: Insufficient documentation

## 2017-08-09 DIAGNOSIS — E119 Type 2 diabetes mellitus without complications: Secondary | ICD-10-CM | POA: Insufficient documentation

## 2017-08-09 DIAGNOSIS — E785 Hyperlipidemia, unspecified: Secondary | ICD-10-CM | POA: Insufficient documentation

## 2017-08-09 DIAGNOSIS — Z1211 Encounter for screening for malignant neoplasm of colon: Secondary | ICD-10-CM

## 2017-08-09 DIAGNOSIS — I10 Essential (primary) hypertension: Secondary | ICD-10-CM | POA: Insufficient documentation

## 2017-08-09 DIAGNOSIS — Z794 Long term (current) use of insulin: Secondary | ICD-10-CM | POA: Insufficient documentation

## 2017-08-09 DIAGNOSIS — E039 Hypothyroidism, unspecified: Secondary | ICD-10-CM | POA: Insufficient documentation

## 2017-08-09 DIAGNOSIS — Z79890 Hormone replacement therapy (postmenopausal): Secondary | ICD-10-CM | POA: Insufficient documentation

## 2017-08-09 LAB — POCT GLYCOSYLATED HEMOGLOBIN (HGB A1C): Hemoglobin A1C: 8.8 % — AB (ref 4.0–5.6)

## 2017-08-09 LAB — GLUCOSE, POCT (MANUAL RESULT ENTRY): POC Glucose: 189 mg/dl — AB (ref 70–99)

## 2017-08-09 MED ORDER — METFORMIN HCL ER 500 MG PO TB24: ORAL_TABLET | ORAL | 5 refills | Status: DC

## 2017-08-09 MED ORDER — INSULIN GLARGINE 100 UNIT/ML SOLOSTAR PEN: 40.0000 [IU] | PEN_INJECTOR | Freq: Every day | SUBCUTANEOUS | 5 refills | Status: DC

## 2017-08-09 MED ORDER — GLIPIZIDE 10 MG PO TABS: ORAL_TABLET | ORAL | 1 refills | Status: DC

## 2017-08-09 MED ORDER — LOVASTATIN 20 MG PO TABS: 20.0000 mg | ORAL_TABLET | Freq: Every day | ORAL | 5 refills | Status: DC

## 2017-08-09 MED ORDER — LISINOPRIL 2.5 MG PO TABS: 2.5000 mg | ORAL_TABLET | Freq: Every day | ORAL | 5 refills | Status: DC

## 2017-08-09 NOTE — Patient Instructions (Signed)
Diabetes Mellitus and Nutrition When you have diabetes (diabetes mellitus), it is very important to have healthy eating habits because your blood sugar (glucose) levels are greatly affected by what you eat and drink. Eating healthy foods in the appropriate amounts, at about the same times every day, can help you:  Control your blood glucose.  Lower your risk of heart disease.  Improve your blood pressure.  Reach or maintain a healthy weight.  Every person with diabetes is different, and each person has different needs for a meal plan. Your health care provider may recommend that you work with a diet and nutrition specialist (dietitian) to make a meal plan that is best for you. Your meal plan may vary depending on factors such as:  The calories you need.  The medicines you take.  Your weight.  Your blood glucose, blood pressure, and cholesterol levels.  Your activity level.  Other health conditions you have, such as heart or kidney disease.  How do carbohydrates affect me? Carbohydrates affect your blood glucose level more than any other type of food. Eating carbohydrates naturally increases the amount of glucose in your blood. Carbohydrate counting is a method for keeping track of how many carbohydrates you eat. Counting carbohydrates is important to keep your blood glucose at a healthy level, especially if you use insulin or take certain oral diabetes medicines. It is important to know how many carbohydrates you can safely have in each meal. This is different for every person. Your dietitian can help you calculate how many carbohydrates you should have at each meal and for snack. Foods that contain carbohydrates include:  Bread, cereal, rice, pasta, and crackers.  Potatoes and corn.  Peas, beans, and lentils.  Milk and yogurt.  Fruit and juice.  Desserts, such as cakes, cookies, ice cream, and candy.  How does alcohol affect me? Alcohol can cause a sudden decrease in blood  glucose (hypoglycemia), especially if you use insulin or take certain oral diabetes medicines. Hypoglycemia can be a life-threatening condition. Symptoms of hypoglycemia (sleepiness, dizziness, and confusion) are similar to symptoms of having too much alcohol. If your health care provider says that alcohol is safe for you, follow these guidelines:  Limit alcohol intake to no more than 1 drink per day for nonpregnant women and 2 drinks per day for men. One drink equals 12 oz of beer, 5 oz of wine, or 1 oz of hard liquor.  Do not drink on an empty stomach.  Keep yourself hydrated with water, diet soda, or unsweetened iced tea.  Keep in mind that regular soda, juice, and other mixers may contain a lot of sugar and must be counted as carbohydrates.  What are tips for following this plan? Reading food labels  Start by checking the serving size on the label. The amount of calories, carbohydrates, fats, and other nutrients listed on the label are based on one serving of the food. Many foods contain more than one serving per package.  Check the total grams (g) of carbohydrates in one serving. You can calculate the number of servings of carbohydrates in one serving by dividing the total carbohydrates by 15. For example, if a food has 30 g of total carbohydrates, it would be equal to 2 servings of carbohydrates.  Check the number of grams (g) of saturated and trans fats in one serving. Choose foods that have low or no amount of these fats.  Check the number of milligrams (mg) of sodium in one serving. Most people   should limit total sodium intake to less than 2,300 mg per day.  Always check the nutrition information of foods labeled as "low-fat" or "nonfat". These foods may be higher in added sugar or refined carbohydrates and should be avoided.  Talk to your dietitian to identify your daily goals for nutrients listed on the label. Shopping  Avoid buying canned, premade, or processed foods. These  foods tend to be high in fat, sodium, and added sugar.  Shop around the outside edge of the grocery store. This includes fresh fruits and vegetables, bulk grains, fresh meats, and fresh dairy. Cooking  Use low-heat cooking methods, such as baking, instead of high-heat cooking methods like deep frying.  Cook using healthy oils, such as olive, canola, or sunflower oil.  Avoid cooking with butter, cream, or high-fat meats. Meal planning  Eat meals and snacks regularly, preferably at the same times every day. Avoid going long periods of time without eating.  Eat foods high in fiber, such as fresh fruits, vegetables, beans, and whole grains. Talk to your dietitian about how many servings of carbohydrates you can eat at each meal.  Eat 4-6 ounces of lean protein each day, such as lean meat, chicken, fish, eggs, or tofu. 1 ounce is equal to 1 ounce of meat, chicken, or fish, 1 egg, or 1/4 cup of tofu.  Eat some foods each day that contain healthy fats, such as avocado, nuts, seeds, and fish. Lifestyle   Check your blood glucose regularly.  Exercise at least 30 minutes 5 or more days each week, or as told by your health care provider.  Take medicines as told by your health care provider.  Do not use any products that contain nicotine or tobacco, such as cigarettes and e-cigarettes. If you need help quitting, ask your health care provider.  Work with a counselor or diabetes educator to identify strategies to manage stress and any emotional and social challenges. What are some questions to ask my health care provider?  Do I need to meet with a diabetes educator?  Do I need to meet with a dietitian?  What number can I call if I have questions?  When are the best times to check my blood glucose? Where to find more information:  American Diabetes Association: diabetes.org/food-and-fitness/food  Academy of Nutrition and Dietetics:  www.eatright.org/resources/health/diseases-and-conditions/diabetes  National Institute of Diabetes and Digestive and Kidney Diseases (NIH): www.niddk.nih.gov/health-information/diabetes/overview/diet-eating-physical-activity Summary  A healthy meal plan will help you control your blood glucose and maintain a healthy lifestyle.  Working with a diet and nutrition specialist (dietitian) can help you make a meal plan that is best for you.  Keep in mind that carbohydrates and alcohol have immediate effects on your blood glucose levels. It is important to count carbohydrates and to use alcohol carefully. This information is not intended to replace advice given to you by your health care provider. Make sure you discuss any questions you have with your health care provider. Document Released: 09/25/2004 Document Revised: 02/03/2016 Document Reviewed: 02/03/2016 Elsevier Interactive Patient Education  2018 Elsevier Inc.  

## 2017-08-09 NOTE — Progress Notes (Signed)
Subjective:  Patient ID: Ricardo Forbes, male    DOB: 03/28/1965  Age: 52 y.o. MRN: [id number]  CC: Diabetes   HPI Ricardo Forbes  is a 52 -year-old male with a history of type 2 diabetes mellitus diagnosed in 11/2003 (A1c 8.8 ), hyperlipidemia, hypothyroidism who comes into the clinic for a follow-up visit.  A1c is 8.8 and has trended up from 8.1 previously and he endorses taking his Lantus as prescribed and has been compliant with metformin and glipizide.  Denies hypoglycemic episodes, neuropathy and is up-to-date on his last eye exam which was earlier this month. He tolerates his levothyroxine and is doing well on his statin with no complaints of adverse effects. With regards to his healthcare maintenance he is not up-to-date on his colonoscopy.  He lacks medical insurance for referral and did not qualify for the [facility] financial discount he states because he was told he may too much money.  Past Medical History:  Diagnosis Date  . Diabetes mellitus without complication ([place])   . Thyroid disease     History reviewed. No pertinent surgical history.  No Known Allergies   Outpatient Medications Prior to Visit  Medication Sig Dispense Refill  . Blood Glucose Monitoring Suppl (TRUE METRIX METER) DEVI 1 each by Does not apply route 3 (three) times daily before meals. 1 Device 0  . glucose blood (TRUE METRIX BLOOD GLUCOSE TEST) test strip Use 3 times daily before meals 100 each 12  . Insulin Pen Needle (B-D UF III MINI PEN NEEDLES) 31G X 5 MM MISC Use as directed once daily 100 each 0  . levothyroxine (SYNTHROID, LEVOTHROID) 112 MCG tablet TAKE ONE TABLET BY MOUTH ONCE DAILY BEFORE BREAKFAST 30 tablet 5  . TRUEPLUS LANCETS 28G MISC 1 each by Does not apply route 3 (three) times daily before meals. 100 each 12  . glipiZIDE (GLUCOTROL) 10 MG tablet TAKE ONE TABLET BY MOUTH TWICE DAILY BEFORE MEAL(S) 180 tablet 1  . Insulin Glargine (LANTUS SOLOSTAR) 100 UNIT/ML Solostar Pen Inject 35 Units  into the skin daily at 10 pm. 45 mL 5  . lisinopril (PRINIVIL,ZESTRIL) 2.5 MG tablet Take 1 tablet (2.5 mg total) by mouth daily. 30 tablet 5  . lovastatin (MEVACOR) 20 MG tablet Take 1 tablet (20 mg total) by mouth at bedtime. 30 tablet 5  . metFORMIN (GLUCOPHAGE XR) 500 MG 24 hr tablet Take 3 tablets in the morning and 2 tablets in the evening by mouth with food 150 tablet 5   No facility-administered medications prior to visit.     ROS Review of Systems  Constitutional: Negative for activity change and appetite change.  HENT: Negative for sinus pressure and sore throat.   Eyes: Negative for visual disturbance.  Respiratory: Negative for cough, chest tightness and shortness of breath.   Cardiovascular: Negative for chest pain and leg swelling.  Gastrointestinal: Negative for abdominal distention, abdominal pain, constipation and diarrhea.  Endocrine: Negative.   Genitourinary: Negative for dysuria.  Musculoskeletal: Negative for joint swelling and myalgias.  Skin: Negative for rash.  Allergic/Immunologic: Negative.   Neurological: Negative for weakness, light-headedness and numbness.  Psychiatric/Behavioral: Negative for dysphoric mood and suicidal ideas.    Objective:  BP 135/80   Pulse 67   Temp 97.9 F (36.6 C) (Oral)   Ht 5' 10"  (1.778 m)   Wt 171 lb 9.6 oz (77.8 kg)   SpO2 98%   BMI 24.62 kg/m   BP/Weight 08/09/2017 03/15/2017 28/76/8115  Systolic BP 726 203 559  Diastolic BP 80 77 86  Wt. (Lbs) 171.6 175 172.3  BMI 24.62 25.11 24.72      Physical Exam  Constitutional: He is oriented to person, place, and time. He appears well-developed and well-nourished.  Neck: No JVD present.  Cardiovascular: Normal rate, normal heart sounds and intact distal pulses.  No murmur heard. Pulmonary/Chest: Effort normal and breath sounds normal. He has no wheezes. He has no rales. He exhibits no tenderness.  Abdominal: Soft. Bowel sounds are normal. He exhibits no distension and  no mass. There is no tenderness.  Musculoskeletal: Normal range of motion.  Neurological: He is alert and oriented to person, place, and time.  Skin: Skin is warm and dry.      CMP Latest Ref Rng & Units 12/14/2016 08/17/2016 05/05/2016  Glucose 65 - 99 mg/dL 95 200(H) 96  BUN 6 - 24 mg/dL 14 10 14   Creatinine 0.76 - 1.27 mg/dL 1.18 1.14 0.98  Sodium 134 - 144 mmol/L 143 143 143  Potassium 3.5 - 5.2 mmol/L 4.6 4.5 4.5  Chloride 96 - 106 mmol/L 103 105 101  CO2 20 - 29 mmol/L 24 22 26   Calcium 8.7 - 10.2 mg/dL 9.7 9.1 9.9  Total Protein 6.0 - 8.5 g/dL 6.9 6.4 7.1  Total Bilirubin 0.0 - 1.2 mg/dL 0.4 0.3 0.3  Alkaline Phos 39 - 117 IU/L 73 83 72  AST 0 - 40 IU/L 34 35 26  ALT 0 - 44 IU/L 40 22 29    Lipid Panel     Component Value Date/Time   CHOL 131 08/17/2016 0958   TRIG 71 08/17/2016 0958   HDL 38 (L) 08/17/2016 0958   CHOLHDL 3.4 08/17/2016 0958   CHOLHDL 3.6 01/20/2016 0831   VLDL 14 02/21/2015 0900   LDLCALC 79 08/17/2016 0958   LDLDIRECT 115 (H) 04/30/2010 1052    Lab Results  Component Value Date   HGBA1C 8.8 (A) 08/09/2017    Lab Results  Component Value Date   TSH 0.513 12/14/2016    Assessment & Plan:   1. Type 2 diabetes mellitus without complication, with long-term current use of insulin (HCC) Uncontrolled with A1c of 8.8 Increased dose of Lantus Counseled on Diabetic diet, my plate method, 182 minutes of moderate intensity exercise/week Keep blood sugar logs with fasting goals of 80-120 mg/dl, random of less than 180 and in the event of sugars less than 60 mg/dl or greater than 400 mg/dl please notify the clinic ASAP. It is recommended that you undergo annual eye exams and annual foot exams. Pneumonia vaccine is recommended. - POCT glucose (manual entry) - POCT glycosylated hemoglobin (Hb A1C) - Insulin Glargine (LANTUS SOLOSTAR) 100 UNIT/ML Solostar Pen; Inject 40 Units into the skin daily at 10 pm.  Dispense: 45 mL; Refill: 5 - CMP14+EGFR;  Future - metFORMIN (GLUCOPHAGE XR) 500 MG 24 hr tablet; Take 3 tablets in the morning and 2 tablets in the evening by mouth with food  Dispense: 150 tablet; Refill: 5 - glipiZIDE (GLUCOTROL) 10 MG tablet; TAKE ONE TABLET BY MOUTH TWICE DAILY BEFORE MEAL(S)  Dispense: 180 tablet; Refill: 1  2. Pure hypercholesterolemia Controlled Low-cholesterol diet - Lipid panel; Future - lovastatin (MEVACOR) 20 MG tablet; Take 1 tablet (20 mg total) by mouth at bedtime.  Dispense: 30 tablet; Refill: 5  3. HYPERTENSION, BENIGN ESSENTIAL Controlled Counseled on blood pressure goal of less than 130/80, low-sodium, DASH diet, medication compliance, 150 minutes of moderate intensity exercise per week. Discussed medication compliance, adverse effects. -  lisinopril (PRINIVIL,ZESTRIL) 2.5 MG tablet; Take 1 tablet (2.5 mg total) by mouth daily.  Dispense: 30 tablet; Refill: 5  4. Screening for colon cancer Would like to place referral for colonoscopy however lack of medical coverage precludes this and he does not qualify for the [facility] financial discount as he makes too much money as per patient. - Fecal occult blood, imunochemical([facility]); Future  5. Hypothyroidism, unspecified type Previously controlled We will send of thyroid labs and adjust  levothyroxine dose accordingly - T4, free - TSH   Meds ordered this encounter  Medications  . Insulin Glargine (LANTUS SOLOSTAR) 100 UNIT/ML Solostar Pen    Sig: Inject 40 Units into the skin daily at 10 pm.    Dispense:  45 mL    Refill:  5    Discontinue previous dose  . metFORMIN (GLUCOPHAGE XR) 500 MG 24 hr tablet    Sig: Take 3 tablets in the morning and 2 tablets in the evening by mouth with food    Dispense:  150 tablet    Refill:  5  . lovastatin (MEVACOR) 20 MG tablet    Sig: Take 1 tablet (20 mg total) by mouth at bedtime.    Dispense:  30 tablet    Refill:  5  . lisinopril (PRINIVIL,ZESTRIL) 2.5 MG tablet    Sig: Take 1 tablet (2.5 mg  total) by mouth daily.    Dispense:  30 tablet    Refill:  5  . glipiZIDE (GLUCOTROL) 10 MG tablet    Sig: TAKE ONE TABLET BY MOUTH TWICE DAILY BEFORE MEAL(S)    Dispense:  180 tablet    Refill:  1    Follow-up: Return in about 3 months (around 11/09/2017) for Follow-up on diabetes mellitus.   Charlott Rakes MD

## 2017-08-10 ENCOUNTER — Other Ambulatory Visit: Payer: Self-pay | Admitting: Family Medicine

## 2017-08-10 DIAGNOSIS — E039 Hypothyroidism, unspecified: Secondary | ICD-10-CM

## 2017-08-10 LAB — T4, FREE: FREE T4: 1.74 ng/dL (ref 0.82–1.77)

## 2017-08-10 LAB — TSH: TSH: 0.209 u[IU]/mL — ABNORMAL LOW (ref 0.450–4.500)

## 2017-08-10 MED ORDER — LEVOTHYROXINE SODIUM 100 MCG PO TABS: ORAL_TABLET | ORAL | 3 refills | Status: DC

## 2017-08-12 ENCOUNTER — Telehealth: Payer: Self-pay

## 2017-08-12 NOTE — Telephone Encounter (Signed)
Patient was called and informed of lab results and medication change. 

## 2017-08-13 ENCOUNTER — Telehealth: Payer: Self-pay

## 2017-08-13 LAB — FECAL OCCULT BLOOD, IMMUNOCHEMICAL: Fecal Occult Bld: NEGATIVE

## 2017-08-13 NOTE — Telephone Encounter (Signed)
Patient was called and informed of lab results. 

## 2017-08-16 ENCOUNTER — Ambulatory Visit: Payer: Self-pay | Attending: Family Medicine

## 2017-08-16 DIAGNOSIS — E119 Type 2 diabetes mellitus without complications: Secondary | ICD-10-CM | POA: Insufficient documentation

## 2017-08-16 DIAGNOSIS — Z794 Long term (current) use of insulin: Secondary | ICD-10-CM | POA: Insufficient documentation

## 2017-08-16 DIAGNOSIS — E7800 Pure hypercholesterolemia, unspecified: Secondary | ICD-10-CM | POA: Insufficient documentation

## 2017-08-16 NOTE — Progress Notes (Signed)
Patient here for lab visit  

## 2017-08-17 LAB — LIPID PANEL
Chol/HDL Ratio: 4 ratio (ref 0.0–5.0)
Cholesterol, Total: 155 mg/dL (ref 100–199)
HDL: 39 mg/dL — ABNORMAL LOW (ref >39)
LDL Calculated: 106 mg/dL — ABNORMAL HIGH (ref 0–99)
Triglycerides: 51 mg/dL (ref 0–149)
VLDL CHOLESTEROL CAL: 10 mg/dL (ref 5–40)

## 2017-08-17 LAB — CMP14+EGFR
ALBUMIN: 4.2 g/dL (ref 3.5–5.5)
ALK PHOS: 75 IU/L (ref 39–117)
ALT: 28 IU/L (ref 0–44)
AST: 19 IU/L (ref 0–40)
Albumin/Globulin Ratio: 1.8 (ref 1.2–2.2)
BUN / CREAT RATIO: 12 (ref 9–20)
BUN: 15 mg/dL (ref 6–24)
Bilirubin Total: 0.4 mg/dL (ref 0.0–1.2)
CO2: 24 mmol/L (ref 20–29)
Calcium: 8.9 mg/dL (ref 8.7–10.2)
Chloride: 104 mmol/L (ref 96–106)
Creatinine, Ser: 1.23 mg/dL (ref 0.76–1.27)
GFR calc Af Amer: 79 mL/min/{1.73_m2} (ref >59)
GFR calc non Af Amer: 68 mL/min/{1.73_m2} (ref >59)
GLUCOSE: 148 mg/dL — AB (ref 65–99)
Globulin, Total: 2.4 g/dL (ref 1.5–4.5)
Potassium: 4.5 mmol/L (ref 3.5–5.2)
Sodium: 142 mmol/L (ref 134–144)
Total Protein: 6.6 g/dL (ref 6.0–8.5)

## 2017-11-15 ENCOUNTER — Encounter: Payer: Self-pay | Admitting: Family Medicine

## 2017-11-15 ENCOUNTER — Ambulatory Visit: Payer: Self-pay | Attending: Family Medicine | Admitting: Family Medicine

## 2017-11-15 VITALS — BP 110/70 | HR 78 | Temp 97.9°F | Ht 70.0 in | Wt 170.2 lb

## 2017-11-15 DIAGNOSIS — Z79899 Other long term (current) drug therapy: Secondary | ICD-10-CM | POA: Insufficient documentation

## 2017-11-15 DIAGNOSIS — Z794 Long term (current) use of insulin: Secondary | ICD-10-CM

## 2017-11-15 DIAGNOSIS — E119 Type 2 diabetes mellitus without complications: Secondary | ICD-10-CM

## 2017-11-15 DIAGNOSIS — Z23 Encounter for immunization: Secondary | ICD-10-CM

## 2017-11-15 DIAGNOSIS — Z79890 Hormone replacement therapy (postmenopausal): Secondary | ICD-10-CM | POA: Insufficient documentation

## 2017-11-15 DIAGNOSIS — I10 Essential (primary) hypertension: Secondary | ICD-10-CM

## 2017-11-15 DIAGNOSIS — E7800 Pure hypercholesterolemia, unspecified: Secondary | ICD-10-CM

## 2017-11-15 DIAGNOSIS — E039 Hypothyroidism, unspecified: Secondary | ICD-10-CM

## 2017-11-15 LAB — POCT GLYCOSYLATED HEMOGLOBIN (HGB A1C): Hemoglobin A1C: 8.1 % — AB (ref 4.0–5.6)

## 2017-11-15 LAB — GLUCOSE, POCT (MANUAL RESULT ENTRY): POC Glucose: 207 mg/dl — AB (ref 70–99)

## 2017-11-15 MED ORDER — INSULIN GLARGINE 100 UNIT/ML SOLOSTAR PEN: 35.0000 [IU] | PEN_INJECTOR | Freq: Every day | SUBCUTANEOUS | 5 refills | Status: DC

## 2017-11-15 MED ORDER — LISINOPRIL 2.5 MG PO TABS: 2.5000 mg | ORAL_TABLET | Freq: Every day | ORAL | 5 refills | Status: DC

## 2017-11-15 MED ORDER — GLIPIZIDE 10 MG PO TABS: ORAL_TABLET | ORAL | 1 refills | Status: DC

## 2017-11-15 MED ORDER — LOVASTATIN 20 MG PO TABS: 20.0000 mg | ORAL_TABLET | Freq: Every day | ORAL | 5 refills | Status: DC

## 2017-11-15 MED ORDER — METFORMIN HCL ER 500 MG PO TB24: ORAL_TABLET | ORAL | 5 refills | Status: DC

## 2017-11-15 NOTE — Progress Notes (Signed)
Subjective:  Patient ID: Ricardo Forbes, male    DOB: February 25, 1965  Age: 52 y.o. MRN: [id number]  CC: Diabetes   HPI Ricardo Forbes is a 52 -year-old male with a history of type 2 diabetes mellitus diagnosed in 11/2003 (A1c 8.1 ), hyperlipidemia, hypothyroidism who comes into the clinic for a follow-up visit.  His A1c is 8.1 which has improved slightly from 8.8 previously and he has been taking 32 units rather than 40 units of Lantus prescribed as he complains of His sugars bottoming out while he was doing 40 units.  He denies visual concerns, numbness in extremities He has been compliant with his statin and denies myalgias and also takes his levothyroxine with no complaints of adverse effects. He tries to adhere to a diabetic diet and exercise. 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 He has no complaints today.  Past Medical History:  Diagnosis Date  . Diabetes mellitus without complication (HCC)   . Thyroid disease     History reviewed. No pertinent surgical history.  No Known Allergies   Outpatient Medications Prior to Visit  Medication Sig Dispense Refill  . Blood Glucose Monitoring Suppl (TRUE METRIX METER) DEVI 1 each by Does not apply route 3 (three) times daily before meals. 1 Device 0  . glucose blood (TRUE METRIX BLOOD GLUCOSE TEST) test strip Use 3 times daily before meals 100 each 12  . Insulin Pen Needle (B-D UF III MINI PEN NEEDLES) 31G X 5 MM MISC Use as directed once daily 100 each 0  . levothyroxine (SYNTHROID, LEVOTHROID) 100 MCG tablet TAKE ONE TABLET BY MOUTH ONCE DAILY BEFORE BREAKFAST 30 tablet 3  . TRUEPLUS LANCETS 28G MISC 1 each by Does not apply route 3 (three) times daily before meals. 100 each 12  . glipiZIDE (GLUCOTROL) 10 MG tablet TAKE ONE TABLET BY MOUTH TWICE DAILY BEFORE MEAL(S) 180 tablet 1  . Insulin Glargine (LANTUS SOLOSTAR) 100  UNIT/ML Solostar Pen Inject 40 Units into the skin daily at 10 pm. 45 mL 5  . lisinopril (PRINIVIL,ZESTRIL) 2.5 MG tablet Take 1 tablet (2.5 mg total) by mouth daily. 30 tablet 5  . lovastatin (MEVACOR) 20 MG tablet Take 1 tablet (20 mg total) by mouth at bedtime. 30 tablet 5  . metFORMIN (GLUCOPHAGE XR) 500 MG 24 hr tablet Take 3 tablets in the morning and 2 tablets in the evening by mouth with food 150 tablet 5   No facility-administered medications prior to visit.     ROS Review of Systems  Constitutional: Negative for activity change and appetite change.  HENT: Negative for sinus pressure and sore throat.   Eyes: Negative for visual disturbance.  Respiratory: Negative for cough, chest tightness and shortness of breath.   Cardiovascular: Negative for chest pain and leg swelling.  Gastrointestinal: Negative for abdominal distention, abdominal pain, constipation and diarrhea.  Endocrine: Negative.   Genitourinary: Negative for dysuria.  Musculoskeletal: Negative for joint swelling and myalgias.  Skin: Negative for rash.  Allergic/Immunologic: Negative.   Neurological: Negative for weakness, light-headedness and numbness.  Psychiatric/Behavioral: Negative for dysphoric mood and suicidal ideas.    Objective:  BP 110/70   Pulse 78   Temp 97.9 F (36.6 C) (Oral)   Ht 5\' 10"  (1.778 m)   Wt 170 lb 3.2 oz (77.2 kg)   SpO2 98%   BMI 24.42 kg/m   BP/Weight 11/15/2017 08/09/2017  03/15/2017  Systolic BP 110 135 132  Diastolic BP 70 80 77  Wt. (Lbs) 170.2 171.6 175  BMI 24.42 24.62 25.11      Physical Exam  Constitutional: He is oriented to person, place, and time. He appears well-developed and well-nourished.  HENT:  Right Ear: External ear normal.  Left Ear: External ear normal.  Mouth/Throat: Oropharynx is clear and moist.  Neck: No JVD present.  Cardiovascular: Normal rate, normal heart sounds and intact distal pulses.  No murmur heard. Pulmonary/Chest: Effort normal and  breath sounds normal. He has no wheezes. He has no rales. He exhibits no tenderness.  Abdominal: Soft. Bowel sounds are normal. He exhibits no distension and no mass. There is no tenderness.  Musculoskeletal: Normal range of motion.  Neurological: He is alert and oriented to person, place, and time.  Skin: Skin is warm and dry.  Psychiatric: He has a normal mood and affect.     CMP Latest Ref Rng & Units 08/16/2017 12/14/2016 08/17/2016  Glucose 65 - 99 mg/dL 161(W) 95 960(A)  BUN 6 - 24 mg/dL 15 14 10   Creatinine 0.76 - 1.27 mg/dL 5.40 9.81 1.91  Sodium 134 - 144 mmol/L 142 143 143  Potassium 3.5 - 5.2 mmol/L 4.5 4.6 4.5  Chloride 96 - 106 mmol/L 104 103 105  CO2 20 - 29 mmol/L 24 24 22   Calcium 8.7 - 10.2 mg/dL 8.9 9.7 9.1  Total Protein 6.0 - 8.5 g/dL 6.6 6.9 6.4  Total Bilirubin 0.0 - 1.2 mg/dL 0.4 0.4 0.3  Alkaline Phos 39 - 117 IU/L 75 73 83  AST 0 - 40 IU/L 19 34 35  ALT 0 - 44 IU/L 28 40 22     Lipid Panel     Component Value Date/Time   CHOL 155 08/16/2017 0940   TRIG 51 08/16/2017 0940   HDL 39 (L) 08/16/2017 0940   CHOLHDL 4.0 08/16/2017 0940   CHOLHDL 3.6 01/20/2016 0831   VLDL 14 02/21/2015 0900   LDLCALC 106 (H) 08/16/2017 0940   LDLDIRECT 115 (H) 04/30/2010 1052    Lab Results  Component Value Date   TSH 0.209 (L) 08/09/2017    Lab Results  Component Value Date   HGBA1C 8.1 (A) 11/15/2017    Assessment & Plan:   1. Type 2 diabetes mellitus without complication, with long-term current use of insulin (HCC) Uncontrolled with A1c of 8.1 which has improved slightly from A1c of 8.8 previously He has been taking 32 units of Lantus and advised to raise this to 35 units. We will be cautious with titrating up further as he has complained of hypoglycemia in the past Counseled on Diabetic diet, my plate method, 478 minutes of moderate intensity exercise/week Keep blood sugar logs with fasting goals of 80-120 mg/dl, random of less than 295 and in the event of  sugars less than 60 mg/dl or greater than 621 mg/dl please notify the clinic ASAP. It is recommended that you undergo annual eye exams and annual foot exams. Pneumonia vaccine is recommended. - POCT glucose (manual entry) - POCT glycosylated hemoglobin (Hb A1C) - glipiZIDE (GLUCOTROL) 10 MG tablet; TAKE ONE TABLET BY MOUTH TWICE DAILY BEFORE MEAL(S)  Dispense: 180 tablet; Refill: 1 - metFORMIN (GLUCOPHAGE XR) 500 MG 24 hr tablet; Take 3 tablets in the morning and 2 tablets in the evening by mouth with food  Dispense: 150 tablet; Refill: 5 - Insulin Glargine (LANTUS SOLOSTAR) 100 UNIT/ML Solostar Pen; Inject 35 Units into the skin daily  at 10 pm.  Dispense: 45 mL; Refill: 5  2. Pure hypercholesterolemia Controlled Low-cholesterol diet - lovastatin (MEVACOR) 20 MG tablet; Take 1 tablet (20 mg total) by mouth at bedtime.  Dispense: 30 tablet; Refill: 5  3. HYPERTENSION, BENIGN ESSENTIAL Controlled Counseled on blood pressure goal of less than 130/80, low-sodium, DASH diet, medication compliance, 150 minutes of moderate intensity exercise per week. Discussed medication compliance, adverse effects. - lisinopril (PRINIVIL,ZESTRIL) 2.5 MG tablet; Take 1 tablet (2.5 mg total) by mouth daily.  Dispense: 30 tablet; Refill: 5  4. Hypothyroidism, unspecified type Suppressed TSH his last office visit Repeat thyroid panel and adjust regimen accordingly - T4, free - TSH   Meds ordered this encounter  Medications  . glipiZIDE (GLUCOTROL) 10 MG tablet    Sig: TAKE ONE TABLET BY MOUTH TWICE DAILY BEFORE MEAL(S)    Dispense:  180 tablet    Refill:  1  . metFORMIN (GLUCOPHAGE XR) 500 MG 24 hr tablet    Sig: Take 3 tablets in the morning and 2 tablets in the evening by mouth with food    Dispense:  150 tablet    Refill:  5  . lovastatin (MEVACOR) 20 MG tablet    Sig: Take 1 tablet (20 mg total) by mouth at bedtime.    Dispense:  30 tablet    Refill:  5  . lisinopril (PRINIVIL,ZESTRIL) 2.5 MG  tablet    Sig: Take 1 tablet (2.5 mg total) by mouth daily.    Dispense:  30 tablet    Refill:  5  . Insulin Glargine (LANTUS SOLOSTAR) 100 UNIT/ML Solostar Pen    Sig: Inject 35 Units into the skin daily at 10 pm.    Dispense:  45 mL    Refill:  5    Discontinue previous dose    Follow-up: Return in about 3 months (around 02/15/2018) for follow up of chronic medical conditions.   Hoy Register MD

## 2017-11-16 ENCOUNTER — Other Ambulatory Visit: Payer: Self-pay | Admitting: Family Medicine

## 2017-11-16 DIAGNOSIS — E039 Hypothyroidism, unspecified: Secondary | ICD-10-CM

## 2017-11-16 LAB — T4, FREE: FREE T4: 1.36 ng/dL (ref 0.82–1.77)

## 2017-11-16 LAB — TSH: TSH: 0.844 u[IU]/mL (ref 0.450–4.500)

## 2017-11-16 MED ORDER — LEVOTHYROXINE SODIUM 100 MCG PO TABS: ORAL_TABLET | ORAL | 3 refills | Status: DC

## 2017-11-22 ENCOUNTER — Telehealth: Payer: Self-pay

## 2017-11-22 NOTE — Telephone Encounter (Signed)
Patient was called and informed of lab results. 

## 2017-11-22 NOTE — Telephone Encounter (Signed)
-----   Message from Hoy Register, MD sent at 11/16/2017 12:47 PM EST ----- Thyroid labs are normal

## 2018-02-16 ENCOUNTER — Ambulatory Visit: Payer: BLUE CROSS/BLUE SHIELD | Attending: Family Medicine | Admitting: Family Medicine

## 2018-02-16 ENCOUNTER — Encounter: Payer: Self-pay | Admitting: Family Medicine

## 2018-02-16 VITALS — BP 132/79 | HR 78 | Temp 97.8°F | Ht 70.0 in | Wt 173.0 lb

## 2018-02-16 DIAGNOSIS — E119 Type 2 diabetes mellitus without complications: Secondary | ICD-10-CM | POA: Diagnosis not present

## 2018-02-16 DIAGNOSIS — E039 Hypothyroidism, unspecified: Secondary | ICD-10-CM | POA: Diagnosis not present

## 2018-02-16 DIAGNOSIS — E7800 Pure hypercholesterolemia, unspecified: Secondary | ICD-10-CM | POA: Diagnosis not present

## 2018-02-16 DIAGNOSIS — I10 Essential (primary) hypertension: Secondary | ICD-10-CM | POA: Diagnosis not present

## 2018-02-16 DIAGNOSIS — Z794 Long term (current) use of insulin: Secondary | ICD-10-CM

## 2018-02-16 LAB — GLUCOSE, POCT (MANUAL RESULT ENTRY): POC Glucose: 89 mg/dl (ref 70–99)

## 2018-02-16 LAB — POCT GLYCOSYLATED HEMOGLOBIN (HGB A1C): HBA1C, POC (CONTROLLED DIABETIC RANGE): 8.6 % — AB (ref 0.0–7.0)

## 2018-02-16 MED ORDER — LISINOPRIL 2.5 MG PO TABS: 2.5000 mg | ORAL_TABLET | Freq: Every day | ORAL | 5 refills | Status: DC

## 2018-02-16 MED ORDER — LOVASTATIN 20 MG PO TABS: 20.0000 mg | ORAL_TABLET | Freq: Every day | ORAL | 5 refills | Status: DC

## 2018-02-16 MED ORDER — METFORMIN HCL ER 500 MG PO TB24: ORAL_TABLET | ORAL | 5 refills | Status: DC

## 2018-02-16 MED ORDER — GLIPIZIDE 10 MG PO TABS: 10.0000 mg | ORAL_TABLET | Freq: Two times a day (BID) | ORAL | 5 refills | Status: DC

## 2018-02-16 MED ORDER — LEVOTHYROXINE SODIUM 100 MCG PO TABS: ORAL_TABLET | ORAL | 3 refills | Status: DC

## 2018-02-16 MED ORDER — INSULIN GLARGINE 100 UNIT/ML SOLOSTAR PEN: 17.0000 [IU] | PEN_INJECTOR | Freq: Two times a day (BID) | SUBCUTANEOUS | 5 refills | Status: DC

## 2018-02-16 NOTE — Progress Notes (Signed)
Subjective:  Patient ID: Ricardo Forbes, male    DOB: 1965/02/22  Age: 53 y.o. MRN: [id number]  CC: Diabetes   HPI Ricardo Forbes  is a 53 -year-old male with a history of type 2 diabetes mellitus diagnosed in 11/2003 (A1c 8.1), hyperlipidemia, hypothyroidism who comes into the clinic for a follow-up visit.  His A1c is 8.6 which is up from 8.1 previously and he informs me he cut back on his Lantus from 35 units to 28 units at nighttime as he experienced his sugars 'bottom ou'.  He complains of a low sugar of 70 which occurs at night and he wakes up sweating.  Denies visual concerns, numbness in extremities and adheres to a diabetic diet and exercise. He drives trucks for living. Doing well on his levothyroxine and his statin and denies adverse effects from his medications.  He has no other concerns today.  Past Medical History:  Diagnosis Date  . Diabetes mellitus without complication (HCC)   . Thyroid disease     History reviewed. No pertinent surgical history.  History reviewed. No pertinent family history.  No Known Allergies  Outpatient Medications Prior to Visit  Medication Sig Dispense Refill  . Blood Glucose Monitoring Suppl (TRUE METRIX METER) DEVI 1 each by Does not apply route 3 (three) times daily before meals. 1 Device 0  . glucose blood (TRUE METRIX BLOOD GLUCOSE TEST) test strip Use 3 times daily before meals 100 each 12  . Insulin Pen Needle (B-D UF III MINI PEN NEEDLES) 31G X 5 MM MISC Use as directed once daily 100 each 0  . TRUEPLUS LANCETS 28G MISC 1 each by Does not apply route 3 (three) times daily before meals. 100 each 12  . glipiZIDE (GLUCOTROL) 10 MG tablet TAKE ONE TABLET BY MOUTH TWICE DAILY BEFORE MEAL(S) 180 tablet 1  . Insulin Glargine (LANTUS SOLOSTAR) 100 UNIT/ML Solostar Pen Inject 35 Units into the skin daily at 10 pm. 45 mL 5  . levothyroxine (SYNTHROID, LEVOTHROID) 100 MCG tablet TAKE ONE TABLET BY MOUTH ONCE DAILY BEFORE BREAKFAST 30 tablet 3    . lisinopril (PRINIVIL,ZESTRIL) 2.5 MG tablet Take 1 tablet (2.5 mg total) by mouth daily. 30 tablet 5  . lovastatin (MEVACOR) 20 MG tablet Take 1 tablet (20 mg total) by mouth at bedtime. 30 tablet 5  . metFORMIN (GLUCOPHAGE XR) 500 MG 24 hr tablet Take 3 tablets in the morning and 2 tablets in the evening by mouth with food 150 tablet 5   No facility-administered medications prior to visit.      ROS Review of Systems General: negative for fever, weight loss, appetite change Eyes: no visual symptoms. ENT: no ear symptoms, no sinus tenderness, no nasal congestion or sore throat. Neck: no pain  Respiratory: no wheezing, shortness of breath, cough Cardiovascular: no chest pain, no dyspnea on exertion, no pedal edema, no orthopnea. Gastrointestinal: no abdominal pain, no diarrhea, no constipation Genito-Urinary: no urinary frequency, no dysuria, no polyuria. Hematologic: no bruising Endocrine: no cold or heat intolerance Neurological: no headaches, no seizures, no tremors Musculoskeletal: no joint pains, no joint swelling Skin: no pruritus, no rash. Psychological: no depression, no anxiety,    Objective:  BP 132/79   Pulse 78   Temp 97.8 F (36.6 C) (Oral)   Ht 5\' 10"  (1.778 m)   Wt 173 lb (78.5 kg)   SpO2 99%   BMI 24.82 kg/m   BP/Weight 02/16/2018 11/15/2017 08/09/2017  Systolic BP 132 110 135  Diastolic BP  79 70 80  Wt. (Lbs) 173 170.2 171.6  BMI 24.82 24.42 24.62      Physical Exam Constitutional: normal appearing,  Eyes: PERRLA HEENT: Head is atraumatic, normal sinuses, normal oropharynx, normal appearing tonsils and palate, tympanic membrane is normal bilaterally. Neck: normal range of motion, no thyromegaly, no JVD Cardiovascular: normal rate and rhythm, normal heart sounds, no murmurs, rub or gallop, no pedal edema Respiratory: clear to auscultation bilaterally, no wheezes, no rales, no rhonchi Abdomen: soft, not tender to palpation, normal bowel sounds, no  enlarged organs Extremities: Full ROM, no tenderness in joints Skin: warm and dry, no lesions. Neurological: alert, oriented x3, cranial nerves I-XII grossly intact , normal motor strength, normal sensation. Psychological: normal mood.   CMP Latest Ref Rng & Units 08/16/2017 12/14/2016 08/17/2016  Glucose 65 - 99 mg/dL 373(S) 95 287(G)  BUN 6 - 24 mg/dL 15 14 10   Creatinine 0.76 - 1.27 mg/dL 8.11 5.72 6.20  Sodium 134 - 144 mmol/L 142 143 143  Potassium 3.5 - 5.2 mmol/L 4.5 4.6 4.5  Chloride 96 - 106 mmol/L 104 103 105  CO2 20 - 29 mmol/L 24 24 22   Calcium 8.7 - 10.2 mg/dL 8.9 9.7 9.1  Total Protein 6.0 - 8.5 g/dL 6.6 6.9 6.4  Total Bilirubin 0.0 - 1.2 mg/dL 0.4 0.4 0.3  Alkaline Phos 39 - 117 IU/L 75 73 83  AST 0 - 40 IU/L 19 34 35  ALT 0 - 44 IU/L 28 40 22    Lipid Panel     Component Value Date/Time   CHOL 155 08/16/2017 0940   TRIG 51 08/16/2017 0940   HDL 39 (L) 08/16/2017 0940   CHOLHDL 4.0 08/16/2017 0940   CHOLHDL 3.6 01/20/2016 0831   VLDL 14 02/21/2015 0900   LDLCALC 106 (H) 08/16/2017 0940   LDLDIRECT 115 (H) 04/30/2010 1052    CBC    Component Value Date/Time   WBC 3.8 12/14/2016 1124   WBC 3.7 (L) 01/11/2015 0953   RBC 4.75 12/14/2016 1124   RBC 4.36 01/11/2015 0953   HGB 14.6 12/14/2016 1124   HCT 43.2 12/14/2016 1124   PLT 282 12/14/2016 1124   MCV 91 12/14/2016 1124   MCH 30.7 12/14/2016 1124   MCH 30.5 01/11/2015 0953   MCHC 33.8 12/14/2016 1124   MCHC 32.9 01/11/2015 0953   RDW 14.3 12/14/2016 1124   LYMPHSABS 1.5 12/14/2016 1124   MONOABS 0.2 01/11/2015 0953   EOSABS 0.2 12/14/2016 1124   BASOSABS 0.0 12/14/2016 1124    Lab Results  Component Value Date   HGBA1C 8.6 (A) 02/16/2018    Assessment & Plan:   1. Type 2 diabetes mellitus without complication, with long-term current use of insulin (HCC) Uncontrolled with A1c of 8.6 which has trended up from 8.1 previously He reduced dose of Lantus due hypoglycemia which could explain poor  control. 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 In the event of hypoglycemia he has been advised reduce both morning and evening doses by 2 units Counseled on Diabetic diet, my plate method, 355 minutes of moderate intensity exercise/week Keep blood sugar logs with fasting goals of 80-120 mg/dl, random of less than 974 and in the event of sugars less than 60 mg/dl or greater than 163 mg/dl please notify the clinic ASAP. It is recommended that you undergo annual eye exams and annual foot exams. Pneumonia vaccine is recommended. - POCT glucose (manual entry) -  POCT glycosylated hemoglobin (Hb A1C) - metFORMIN (GLUCOPHAGE XR) 500 MG 24 hr tablet; Take 3 tablets in the morning and 2 tablets in the evening by mouth with food  Dispense: 120 tablet; Refill: 5 - Insulin Glargine (LANTUS SOLOSTAR) 100 UNIT/ML Solostar Pen; Inject 17 Units into the skin 2 (two) times daily.  Dispense: 45 mL; Refill: 5 - Basic Metabolic Panel - glipiZIDE (GLUCOTROL) 10 MG tablet; Take 1 tablet (10 mg total) by mouth 2 (two) times daily before a meal. TAKE ONE TABLET BY MOUTH TWICE DAILY BEFORE MEAL(S)  Dispense: 60 tablet; Refill: 5  2. Acquired hypothyroidism Controlled - levothyroxine (SYNTHROID, LEVOTHROID) 100 MCG tablet; TAKE ONE TABLET BY MOUTH ONCE DAILY BEFORE BREAKFAST  Dispense: 30 tablet; Refill: 3  3. HYPERTENSION, BENIGN ESSENTIAL Controlled Counseled on blood pressure goal of less than 130/80, low-sodium, DASH diet, medication compliance, 150 minutes of moderate intensity exercise per week. Discussed medication compliance, adverse effects. - lisinopril (PRINIVIL,ZESTRIL) 2.5 MG tablet; Take 1 tablet (2.5 mg total) by mouth daily.  Dispense: 30 tablet; Refill: 5  4. Pure hypercholesterolemia Controlled - lovastatin (MEVACOR) 20 MG tablet; Take 1 tablet (20 mg total) by mouth at bedtime.  Dispense: 30 tablet; Refill: 5   Meds ordered this encounter   Medications  . metFORMIN (GLUCOPHAGE XR) 500 MG 24 hr tablet    Sig: Take 3 tablets in the morning and 2 tablets in the evening by mouth with food    Dispense:  120 tablet    Refill:  5  . Insulin Glargine (LANTUS SOLOSTAR) 100 UNIT/ML Solostar Pen    Sig: Inject 17 Units into the skin 2 (two) times daily.    Dispense:  45 mL    Refill:  5    Discontinue previous dose  . glipiZIDE (GLUCOTROL) 10 MG tablet    Sig: Take 1 tablet (10 mg total) by mouth 2 (two) times daily before a meal. TAKE ONE TABLET BY MOUTH TWICE DAILY BEFORE MEAL(S)    Dispense:  60 tablet    Refill:  5  . levothyroxine (SYNTHROID, LEVOTHROID) 100 MCG tablet    Sig: TAKE ONE TABLET BY MOUTH ONCE DAILY BEFORE BREAKFAST    Dispense:  30 tablet    Refill:  3    Discontinue previous dose  . lisinopril (PRINIVIL,ZESTRIL) 2.5 MG tablet    Sig: Take 1 tablet (2.5 mg total) by mouth daily.    Dispense:  30 tablet    Refill:  5  . lovastatin (MEVACOR) 20 MG tablet    Sig: Take 1 tablet (20 mg total) by mouth at bedtime.    Dispense:  30 tablet    Refill:  5    Follow-up: Return in about 3 months (around 05/17/2018) for Follow-up of chronic medical conditions.   Hoy RegisterEnobong Malyn Aytes MD    Hoy RegisterEnobong Pearl Bents, MD, FAAFP. [facility] [place], [place] [phone]   02/16/2018, 4:34 PM

## 2018-02-17 ENCOUNTER — Emergency Department (HOSPITAL_BASED_OUTPATIENT_CLINIC_OR_DEPARTMENT_OTHER)
Admission: EM | Admit: 2018-02-17 | Discharge: 2018-02-18 | Disposition: A | Discharge status: Home / Self Care | Payer: BLUE CROSS/BLUE SHIELD | Attending: Emergency Medicine | Admitting: Emergency Medicine

## 2018-02-17 ENCOUNTER — Other Ambulatory Visit: Payer: Self-pay

## 2018-02-17 ENCOUNTER — Encounter (HOSPITAL_BASED_OUTPATIENT_CLINIC_OR_DEPARTMENT_OTHER): Payer: Self-pay | Admitting: *Deleted

## 2018-02-17 DIAGNOSIS — Z794 Long term (current) use of insulin: Secondary | ICD-10-CM | POA: Insufficient documentation

## 2018-02-17 DIAGNOSIS — R197 Diarrhea, unspecified: Secondary | ICD-10-CM

## 2018-02-17 DIAGNOSIS — E119 Type 2 diabetes mellitus without complications: Secondary | ICD-10-CM | POA: Insufficient documentation

## 2018-02-17 DIAGNOSIS — R112 Nausea with vomiting, unspecified: Secondary | ICD-10-CM | POA: Insufficient documentation

## 2018-02-17 DIAGNOSIS — E039 Hypothyroidism, unspecified: Secondary | ICD-10-CM | POA: Insufficient documentation

## 2018-02-17 DIAGNOSIS — Z79899 Other long term (current) drug therapy: Secondary | ICD-10-CM | POA: Insufficient documentation

## 2018-02-17 DIAGNOSIS — I10 Essential (primary) hypertension: Secondary | ICD-10-CM | POA: Diagnosis not present

## 2018-02-17 LAB — COMPREHENSIVE METABOLIC PANEL
ALT: 30 U/L (ref 0–44)
ANION GAP: 9 (ref 5–15)
AST: 30 U/L (ref 15–41)
Albumin: 4.5 g/dL (ref 3.5–5.0)
Alkaline Phosphatase: 63 U/L (ref 38–126)
BUN: 17 mg/dL (ref 6–20)
CO2: 25 mmol/L (ref 22–32)
Calcium: 8.9 mg/dL (ref 8.9–10.3)
Chloride: 105 mmol/L (ref 98–111)
Creatinine, Ser: 1.35 mg/dL — ABNORMAL HIGH (ref 0.61–1.24)
GFR calc Af Amer: >60 mL/min (ref >60)
Glucose, Bld: 156 mg/dL — ABNORMAL HIGH (ref 70–99)
Potassium: 4 mmol/L (ref 3.5–5.1)
Sodium: 139 mmol/L (ref 135–145)
Total Bilirubin: 1.1 mg/dL (ref 0.3–1.2)
Total Protein: 7.4 g/dL (ref 6.5–8.1)

## 2018-02-17 LAB — BASIC METABOLIC PANEL
BUN / CREAT RATIO: 12 (ref 9–20)
BUN: 14 mg/dL (ref 6–24)
CALCIUM: 9.6 mg/dL (ref 8.7–10.2)
CHLORIDE: 101 mmol/L (ref 96–106)
CO2: 20 mmol/L (ref 20–29)
Creatinine, Ser: 1.21 mg/dL (ref 0.76–1.27)
GFR calc Af Amer: 80 mL/min/{1.73_m2} (ref >59)
GFR calc non Af Amer: 69 mL/min/{1.73_m2} (ref >59)
Glucose: 90 mg/dL (ref 65–99)
Potassium: 4.4 mmol/L (ref 3.5–5.2)
SODIUM: 141 mmol/L (ref 134–144)

## 2018-02-17 LAB — CBC
HEMATOCRIT: 45 % (ref 39.0–52.0)
HEMOGLOBIN: 14.4 g/dL (ref 13.0–17.0)
MCH: 30.1 pg (ref 26.0–34.0)
MCHC: 32 g/dL (ref 30.0–36.0)
MCV: 94.1 fL (ref 80.0–100.0)
Platelets: 249 10*3/uL (ref 150–400)
RBC: 4.78 MIL/uL (ref 4.22–5.81)
RDW: 13.3 % (ref 11.5–15.5)
WBC: 7.3 10*3/uL (ref 4.0–10.5)
nRBC: 0 % (ref 0.0–0.2)

## 2018-02-17 LAB — LIPASE, BLOOD: LIPASE: 26 U/L (ref 11–51)

## 2018-02-17 MED ORDER — ONDANSETRON HCL 4 MG/2ML IJ SOLN
4.0000 mg | Freq: Once | INTRAMUSCULAR | Status: AC
  Administered 2018-02-17: 4 mg via INTRAVENOUS
  Filled 2018-02-17: qty 2

## 2018-02-17 MED ORDER — SODIUM CHLORIDE 0.9% FLUSH
3.0000 mL | Freq: Once | INTRAVENOUS | Status: DC
  Filled 2018-02-17: qty 3

## 2018-02-17 MED ORDER — SODIUM CHLORIDE 0.9 % IV BOLUS
1000.0000 mL | Freq: Once | INTRAVENOUS | Status: AC
  Administered 2018-02-17: 1000 mL via INTRAVENOUS

## 2018-02-17 NOTE — ED Triage Notes (Signed)
Abdominal pain x 4 hours. He had diarrhea x 1, vomiting and chills.

## 2018-02-17 NOTE — ED Provider Notes (Signed)
[facility] EMERGENCY DEPARTMENT Provider Note   CSN: [id number] Arrival date & time: 02/17/18  2053     History   Chief Complaint Chief Complaint  Patient presents with  . Abdominal Pain  . Emesis  . Diarrhea    HPI Ricardo Forbes is a 53 y.o. male.  The history is provided by the patient and the spouse.  Diarrhea  Quality:  Watery Severity:  Moderate Onset quality:  Gradual Number of episodes:  5 Timing:  Intermittent Progression:  Unchanged Relieved by:  Nothing Worsened by:  Nothing Ineffective treatments:  None tried Associated symptoms: vomiting   Associated symptoms: no fever   Vomiting:    Quality:  Stomach contents   Severity:  Severe   Timing:  Intermittent   Progression:  Unchanged Risk factors: sick contacts   Risk factors comment:  Patient's child had same this week   Past Medical History:  Diagnosis Date  . Diabetes mellitus without complication (HCC)   . Thyroid disease     Patient Active Problem List   Diagnosis Date Noted  . Erectile dysfunction associated with type 2 diabetes mellitus (HCC) 05/30/2010  . ABSCESS, TOOTH 12/04/2009  . Diabetes (HCC) 06/14/2009  . Hyperlipidemia 02/11/2009  . Hypothyroidism 02/01/2009  . Diabetes mellitus (HCC) 02/01/2009  . PERIPHERAL NEUROPATHY 02/01/2009  . BORDERLINE GLAUCOMA WITH OCULAR HYPERTENSION 02/01/2009  . HYPERTENSION, BENIGN ESSENTIAL 02/01/2009  . UNSPECIFIED DENTAL CARIES 02/01/2009    History reviewed. No pertinent surgical history.      Home Medications    Prior to Admission medications   Medication Sig Start Date End Date Taking? Authorizing Provider  Blood Glucose Monitoring Suppl (TRUE METRIX METER) DEVI 1 each by Does not apply route 3 (three) times daily before meals. 03/15/17   Hoy RegisterNewlin, Enobong, MD  glipiZIDE (GLUCOTROL) 10 MG tablet Take 1 tablet (10 mg total) by mouth 2 (two) times daily before a meal. TAKE ONE TABLET BY MOUTH TWICE DAILY BEFORE MEAL(S) 02/16/18    Hoy RegisterNewlin, Enobong, MD  glucose blood (TRUE METRIX BLOOD GLUCOSE TEST) test strip Use 3 times daily before meals 03/15/17   Hoy RegisterNewlin, Enobong, MD  Insulin Glargine (LANTUS SOLOSTAR) 100 UNIT/ML Solostar Pen Inject 17 Units into the skin 2 (two) times daily. 02/16/18   Hoy RegisterNewlin, Enobong, MD  Insulin Pen Needle (B-D UF III MINI PEN NEEDLES) 31G X 5 MM MISC Use as directed once daily 12/15/16   Hoy RegisterNewlin, Enobong, MD  levothyroxine (SYNTHROID, LEVOTHROID) 100 MCG tablet TAKE ONE TABLET BY MOUTH ONCE DAILY BEFORE BREAKFAST 02/16/18   Hoy RegisterNewlin, Enobong, MD  lisinopril (PRINIVIL,ZESTRIL) 2.5 MG tablet Take 1 tablet (2.5 mg total) by mouth daily. 02/16/18   Hoy RegisterNewlin, Enobong, MD  lovastatin (MEVACOR) 20 MG tablet Take 1 tablet (20 mg total) by mouth at bedtime. 02/16/18   Hoy RegisterNewlin, Enobong, MD  metFORMIN (GLUCOPHAGE XR) 500 MG 24 hr tablet Take 3 tablets in the morning and 2 tablets in the evening by mouth with food 02/16/18   Hoy RegisterNewlin, Enobong, MD  TRUEPLUS LANCETS 28G MISC 1 each by Does not apply route 3 (three) times daily before meals. 03/15/17   Hoy RegisterNewlin, Enobong, MD    Family History No family history on file.  Social History Social History   Tobacco Use  . Smoking status: Never Smoker  . Smokeless tobacco: Never Used  Substance Use Topics  . Alcohol use: No    Frequency: Never  . Drug use: No     Allergies   Patient has no known allergies.  Review of Systems Review of Systems  Constitutional: Negative for fever.  Respiratory: Negative for shortness of breath.   Cardiovascular: Negative for chest pain.  Gastrointestinal: Positive for diarrhea, nausea and vomiting.  All other systems reviewed and are negative.    Physical Exam Updated Vital Signs BP 108/68   Pulse (!) 110   Temp 99.8 F (37.7 C) (Oral)   Resp 18   Ht 5\' 10"  (1.778 m)   Wt 78.4 kg   SpO2 99%   BMI 24.80 kg/m   Physical Exam Vitals signs and nursing note reviewed.  Constitutional:      General: He is not in acute distress.     Appearance: Normal appearance.  HENT:     Head: Normocephalic and atraumatic.     Nose: Nose normal.     Mouth/Throat:     Mouth: Mucous membranes are moist.     Pharynx: Oropharynx is clear.  Eyes:     Conjunctiva/sclera: Conjunctivae normal.     Pupils: Pupils are equal, round, and reactive to light.  Neck:     Musculoskeletal: Normal range of motion and neck supple.  Cardiovascular:     Rate and Rhythm: Normal rate and regular rhythm.     Pulses: Normal pulses.     Heart sounds: Normal heart sounds.  Pulmonary:     Effort: Pulmonary effort is normal.     Breath sounds: Normal breath sounds.  Abdominal:     General: Abdomen is flat. Bowel sounds are normal.     Palpations: There is no mass.     Tenderness: There is no abdominal tenderness. There is no guarding or rebound.     Hernia: No hernia is present.  Musculoskeletal: Normal range of motion.  Skin:    General: Skin is warm and dry.     Capillary Refill: Capillary refill takes less than 2 seconds.  Neurological:     General: No focal deficit present.     Mental Status: He is alert and oriented to person, place, and time.  Psychiatric:        Mood and Affect: Mood normal.        Behavior: Behavior normal.      ED Treatments / Results  Labs (all labs ordered are listed, but only abnormal results are displayed) Results for orders placed or performed during the hospital encounter of 02/17/18  Lipase, blood  Result Value Ref Range   Lipase 26 11 - 51 U/L  Comprehensive metabolic panel  Result Value Ref Range   Sodium 139 135 - 145 mmol/L   Potassium 4.0 3.5 - 5.1 mmol/L   Chloride 105 98 - 111 mmol/L   CO2 25 22 - 32 mmol/L   Glucose, Bld 156 (H) 70 - 99 mg/dL   BUN 17 6 - 20 mg/dL   Creatinine, Ser 2.95 (H) 0.61 - 1.24 mg/dL   Calcium 8.9 8.9 - 62.1 mg/dL   Total Protein 7.4 6.5 - 8.1 g/dL   Albumin 4.5 3.5 - 5.0 g/dL   AST 30 15 - 41 U/L   ALT 30 0 - 44 U/L   Alkaline Phosphatase 63 38 - 126 U/L   Total  Bilirubin 1.1 0.3 - 1.2 mg/dL   GFR calc non Af Amer >60 >60 mL/min   GFR calc Af Amer >60 >60 mL/min   Anion gap 9 5 - 15  CBC  Result Value Ref Range   WBC 7.3 4.0 - 10.5 K/uL   RBC 4.78 4.22 -  5.81 MIL/uL   Hemoglobin 14.4 13.0 - 17.0 g/dL   HCT 14.7 09.2 - 95.7 %   MCV 94.1 80.0 - 100.0 fL   MCH 30.1 26.0 - 34.0 pg   MCHC 32.0 30.0 - 36.0 g/dL   RDW 47.3 40.3 - 70.9 %   Platelets 249 150 - 400 K/uL   nRBC 0.0 0.0 - 0.2 %   No results found.  EKG None  Radiology No results found.  Procedures Procedures (including critical care time)  Medications Ordered in ED Medications  ondansetron (ZOFRAN) injection 4 mg (has no administration in time range)  sodium chloride 0.9 % bolus 1,000 mL (has no administration in time range)    Family members with same exam and vitals are benign and reassuring PO challenged successfully in the department.  Bland diet x 48 hours.    Final Clinical Impressions(s) / ED Diagnoses   Return for pain, intractable cough, fevers >100.4 unrelieved by medication, shortness of breath, intractable vomiting, chest pain, shortness of breath, weakness numbness, changes in speech, facial asymmetry,  abdominal pain, passing out,Inability to tolerate liquids or food, cough, altered mental status or any concerns. No signs of systemic illness or infection. The patient is nontoxic-appearing on exam and vital signs are within normal limits.   I have reviewed the triage vital signs and the nursing notes. Pertinent labs &imaging results that were available during my care of the patient were reviewed by me and considered in my medical decision making (see chart for details).  After history, exam, and medical workup I feel the patient has been appropriately medically screened and is safe for discharge home. Pertinent diagnoses were discussed with the patient. Patient was given return precautions.   Xavior Niazi, MD 02/18/18 0025

## 2018-02-18 ENCOUNTER — Encounter (HOSPITAL_BASED_OUTPATIENT_CLINIC_OR_DEPARTMENT_OTHER): Payer: Self-pay | Admitting: Emergency Medicine

## 2018-02-18 ENCOUNTER — Telehealth: Payer: Self-pay

## 2018-02-18 LAB — URINALYSIS, ROUTINE W REFLEX MICROSCOPIC
Bilirubin Urine: NEGATIVE
Glucose, UA: 100 mg/dL — AB
HGB URINE DIPSTICK: NEGATIVE
KETONES UR: 15 mg/dL — AB
Leukocytes, UA: NEGATIVE
Nitrite: NEGATIVE
Protein, ur: NEGATIVE mg/dL
Specific Gravity, Urine: >1.03 — ABNORMAL HIGH (ref 1.005–1.030)
pH: 5.5 (ref 5.0–8.0)

## 2018-02-18 MED ORDER — ONDANSETRON 8 MG PO TBDP: ORAL_TABLET | ORAL | 0 refills | Status: DC

## 2018-02-18 MED ORDER — ACETAMINOPHEN 325 MG PO TABS
650.0000 mg | ORAL_TABLET | Freq: Once | ORAL | Status: AC
  Administered 2018-02-18: 650 mg via ORAL
  Filled 2018-02-18: qty 2

## 2018-02-18 MED ORDER — DICYCLOMINE HCL 20 MG PO TABS: 20.0000 mg | ORAL_TABLET | Freq: Two times a day (BID) | ORAL | 0 refills | Status: DC

## 2018-02-18 NOTE — Telephone Encounter (Signed)
-----   Message from Hoy Register, MD sent at 02/17/2018  1:57 PM EST ----- Please inform the patient that labs are normal. Thank you.

## 2018-02-18 NOTE — Telephone Encounter (Signed)
Patient was called and informed of lab results via voicemail. 

## 2018-02-18 NOTE — ED Notes (Signed)
MD informed of temp at discharge. Po tylenol given

## 2018-03-02 MED FILL — !LANTUS SOLOSTAR 100UNITS/M: 100 | 25 days supply | Qty: 9 | Fill #1

## 2018-05-17 ENCOUNTER — Other Ambulatory Visit: Payer: Self-pay

## 2018-05-17 ENCOUNTER — Encounter: Payer: Self-pay | Admitting: Family Medicine

## 2018-05-17 ENCOUNTER — Ambulatory Visit: Payer: BLUE CROSS/BLUE SHIELD | Attending: Family Medicine | Admitting: Family Medicine

## 2018-05-17 DIAGNOSIS — E119 Type 2 diabetes mellitus without complications: Secondary | ICD-10-CM

## 2018-05-17 DIAGNOSIS — I10 Essential (primary) hypertension: Secondary | ICD-10-CM | POA: Diagnosis not present

## 2018-05-17 DIAGNOSIS — Z794 Long term (current) use of insulin: Secondary | ICD-10-CM

## 2018-05-17 DIAGNOSIS — E7800 Pure hypercholesterolemia, unspecified: Secondary | ICD-10-CM

## 2018-05-17 DIAGNOSIS — E039 Hypothyroidism, unspecified: Secondary | ICD-10-CM | POA: Diagnosis not present

## 2018-05-17 MED ORDER — LEVOTHYROXINE SODIUM 100 MCG PO TABS: ORAL_TABLET | ORAL | 3 refills | Status: DC

## 2018-05-17 MED ORDER — LISINOPRIL 2.5 MG PO TABS: 2.5000 mg | ORAL_TABLET | Freq: Every day | ORAL | 5 refills | Status: DC

## 2018-05-17 MED ORDER — INSULIN GLARGINE 100 UNIT/ML SOLOSTAR PEN: 30.0000 [IU] | PEN_INJECTOR | Freq: Two times a day (BID) | SUBCUTANEOUS | 5 refills | Status: DC

## 2018-05-17 MED ORDER — METFORMIN HCL ER 500 MG PO TB24: ORAL_TABLET | ORAL | 5 refills | Status: DC

## 2018-05-17 MED ORDER — GLIPIZIDE 10 MG PO TABS: 10.0000 mg | ORAL_TABLET | Freq: Two times a day (BID) | ORAL | 5 refills | Status: DC

## 2018-05-17 MED ORDER — LOVASTATIN 20 MG PO TABS: 20.0000 mg | ORAL_TABLET | Freq: Every day | ORAL | 5 refills | Status: DC

## 2018-05-17 MED FILL — !LANTUS SOLOSTAR 100UNITS/M: 100 | 30 days supply | Qty: 18 | Fill #0

## 2018-05-17 NOTE — Progress Notes (Signed)
Virtual Visit via Telephone Note  I connected with Ricardo Forbes, on 05/17/2018 at 3:27 PM by telephone due to the COVID-19 pandemic and verified that I am speaking with the correct person using two identifiers.   Consent: I discussed the limitations, risks, security and privacy concerns of performing an evaluation and management service by telephone and the availability of in person appointments. I also discussed with the patient that there may be a patient responsible charge related to this service. The patient expressed understanding and agreed to proceed.   Location of Patient: Home  Location of Provider: Clinic   Persons participating in Telemedicine visit: Samara DeistLeander Fritchman  Alicia Farrington-CMA Dr. Nelwyn SalisburyNewlin-PCP     History of Present Illness: Ricardo AmabileLeander Scarpulla  is a 53 -year-old male with a history of type 2 diabetes mellitus diagnosed in 11/2003 (A1c 8.6), hyperlipidemia, hypothyroidism who comes into the clinic for a follow-up visit.  At his last visit I had split Lantus to 17 units twice daily to prevent hypoglycemia which he had experienced with 34 units of Lantus however he never did so and has been administering 30 units at bedtime.  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 He has had a single low blood sugar of 75 which woke him up at night and he had to eat something but his fasting sugars otherwise have been in the 120s.  Denies visual concerns or neuropathy. He is compliant with his statin and levothyroxine and denies adverse effects from his medications. Denies chest pain or dyspnea and has no pedal edema.   Past Medical History:  Diagnosis Date  . Diabetes mellitus without complication (HCC)   . Thyroid disease    No Known Allergies  Current Outpatient Medications on File Prior to Visit  Medication Sig Dispense Refill  . Blood Glucose Monitoring Suppl (TRUE METRIX METER) DEVI 1 each by Does not apply  route 3 (three) times daily before meals. 1 Device 0  . glipiZIDE (GLUCOTROL) 10 MG tablet Take 1 tablet (10 mg total) by mouth 2 (two) times daily before a meal. TAKE ONE TABLET BY MOUTH TWICE DAILY BEFORE MEAL(S) 60 tablet 5  . glucose blood (TRUE METRIX BLOOD GLUCOSE TEST) test strip Use 3 times daily before meals 100 each 12  . Insulin Glargine (LANTUS SOLOSTAR) 100 UNIT/ML Solostar Pen Inject 17 Units into the skin 2 (two) times daily. 45 mL 5  . Insulin Pen Needle (B-D UF III MINI PEN NEEDLES) 31G X 5 MM MISC Use as directed once daily 100 each 0  . levothyroxine (SYNTHROID, LEVOTHROID) 100 MCG tablet TAKE ONE TABLET BY MOUTH ONCE DAILY BEFORE BREAKFAST 30 tablet 3  . lisinopril (PRINIVIL,ZESTRIL) 2.5 MG tablet Take 1 tablet (2.5 mg total) by mouth daily. 30 tablet 5  . lovastatin (MEVACOR) 20 MG tablet Take 1 tablet (20 mg total) by mouth at bedtime. 30 tablet 5  . metFORMIN (GLUCOPHAGE XR) 500 MG 24 hr tablet Take 3 tablets in the morning and 2 tablets in the evening by mouth with food 120 tablet 5  . TRUEPLUS LANCETS 28G MISC 1 each by Does not apply route 3 (three) times daily before meals. 100 each 12  . dicyclomine (BENTYL) 20 MG tablet Take 1 tablet (20 mg total) by mouth 2 (two) times daily. (Patient not taking: Reported on 05/17/2018) 20 tablet 0  . ondansetron (ZOFRAN ODT) 8 MG disintegrating tablet 8mg  ODT q8 hours prn nausea (  Patient not taking: Reported on 05/17/2018) 4 tablet 0   No current facility-administered medications on file prior to visit.     Observations/Objective: Awake, alert, oriented x3 Not in acute distress  CMP Latest Ref Rng & Units 02/17/2018 02/16/2018 08/16/2017  Glucose 70 - 99 mg/dL 357(S) 90 177(L)  BUN 6 - 20 mg/dL 17 14 15   Creatinine 0.61 - 1.24 mg/dL 3.90(Z) 0.09 2.33  Sodium 135 - 145 mmol/L 139 141 142  Potassium 3.5 - 5.1 mmol/L 4.0 4.4 4.5  Chloride 98 - 111 mmol/L 105 101 104  CO2 22 - 32 mmol/L 25 20 24   Calcium 8.9 - 10.3 mg/dL 8.9 9.6 8.9   Total Protein 6.5 - 8.1 g/dL 7.4 - 6.6  Total Bilirubin 0.3 - 1.2 mg/dL 1.1 - 0.4  Alkaline Phos 38 - 126 U/L 63 - 75  AST 15 - 41 U/L 30 - 19  ALT 0 - 44 U/L 30 - 28    Lipid Panel     Component Value Date/Time   CHOL 155 08/16/2017 0940   TRIG 51 08/16/2017 0940   HDL 39 (L) 08/16/2017 0940   CHOLHDL 4.0 08/16/2017 0940   CHOLHDL 3.6 01/20/2016 0831   VLDL 14 02/21/2015 0900   LDLCALC 106 (H) 08/16/2017 0940   LDLDIRECT 115 (H) 04/30/2010 1052    Lab Results  Component Value Date   TSH 0.844 11/15/2017    Lab Results  Component Value Date   HGBA1C 8.6 (A) 02/16/2018    Assessment and Plan: 1. Type 2 diabetes mellitus without complication, with long-term current use of insulin (HCC) Uncontrolled with A1c of 8.6 Hypoglycemic episodes preclude uptitrating Lantus We will switch back to Lantus 30 units at bedtime for ease of administration Counseled on Diabetic diet, my plate method, 007 minutes of moderate intensity exercise/week Keep blood sugar logs with fasting goals of 80-120 mg/dl, random of less than 622 and in the event of sugars less than 60 mg/dl or greater than 633 mg/dl please notify the clinic ASAP. It is recommended that you undergo annual eye exams and annual foot exams. Pneumonia vaccine is recommended. - metFORMIN (GLUCOPHAGE XR) 500 MG 24 hr tablet; Take 3 tablets in the morning and 2 tablets in the evening by mouth with food  Dispense: 120 tablet; Refill: 5 - Insulin Glargine (LANTUS SOLOSTAR) 100 UNIT/ML Solostar Pen; Inject 30 Units into the skin 2 (two) times daily.  Dispense: 30 mL; Refill: 5 - glipiZIDE (GLUCOTROL) 10 MG tablet; Take 1 tablet (10 mg total) by mouth 2 (two) times daily before a meal. TAKE ONE TABLET BY MOUTH TWICE DAILY BEFORE MEAL(S)  Dispense: 60 tablet; Refill: 5  2. Pure hypercholesterolemia Controlled Low-cholesterol diet - lovastatin (MEVACOR) 20 MG tablet; Take 1 tablet (20 mg total) by mouth at bedtime.  Dispense: 30 tablet;  Refill: 5  3. HYPERTENSION, BENIGN ESSENTIAL Controlled at previous visit Counseled on blood pressure goal of less than 130/80, low-sodium, DASH diet, medication compliance, 150 minutes of moderate intensity exercise per week. Discussed medication compliance, adverse effects. - lisinopril (ZESTRIL) 2.5 MG tablet; Take 1 tablet (2.5 mg total) by mouth daily.  Dispense: 30 tablet; Refill: 5  4. Acquired hypothyroidism Controlled - levothyroxine (SYNTHROID) 100 MCG tablet; TAKE ONE TABLET BY MOUTH ONCE DAILY BEFORE BREAKFAST  Dispense: 30 tablet; Refill: 3   Follow Up Instructions: Return in about 3 months (around 08/17/2018).    I discussed the assessment and treatment plan with the patient. The patient was provided an  opportunity to ask questions and all were answered. The patient agreed with the plan and demonstrated an understanding of the instructions.   The patient was advised to call back or seek an in-person evaluation if the symptoms worsen or if the condition fails to improve as anticipated.     I provided 16 minutes total of non-face-to-face time during this encounter including median intraservice time, reviewing previous notes, labs, imaging, medications and explaining diagnosis and management.     Hoy Register, MD, FAAFP. [facility] [place], [place] [phone]   05/17/2018, 3:27 PM

## 2018-05-17 NOTE — Progress Notes (Signed)
Patient has been called and DOB has been verified. Patient has been screened and transferred to PCP to start phone visit.  C/C: diabetes   

## 2018-05-18 ENCOUNTER — Telehealth: Payer: Self-pay | Admitting: Family Medicine

## 2018-05-18 NOTE — Telephone Encounter (Signed)
Pt called in wanting to verify address for med delivery [address] [place] [address]

## 2018-06-13 ENCOUNTER — Telehealth: Payer: Self-pay | Admitting: Family Medicine

## 2018-06-13 NOTE — Telephone Encounter (Signed)
New Message   Pt calling to discuss health issues, did not want to go into detail. Please f/u

## 2018-06-14 NOTE — Telephone Encounter (Signed)
Patient has been called and given tele appointment with PCP on 06/16/2018

## 2018-06-16 ENCOUNTER — Encounter: Payer: Self-pay | Admitting: Family Medicine

## 2018-06-16 ENCOUNTER — Ambulatory Visit: Payer: BC Managed Care – PPO | Attending: Family Medicine | Admitting: Family Medicine

## 2018-06-16 ENCOUNTER — Other Ambulatory Visit: Payer: Self-pay

## 2018-06-16 DIAGNOSIS — T675XXA Heat exhaustion, unspecified, initial encounter: Secondary | ICD-10-CM | POA: Diagnosis not present

## 2018-06-16 DIAGNOSIS — R05 Cough: Secondary | ICD-10-CM

## 2018-06-16 DIAGNOSIS — E119 Type 2 diabetes mellitus without complications: Secondary | ICD-10-CM

## 2018-06-16 DIAGNOSIS — R5383 Other fatigue: Secondary | ICD-10-CM | POA: Diagnosis not present

## 2018-06-16 NOTE — Progress Notes (Signed)
Virtual Visit via Telephone Note  I connected with [name], on 06/16/2018 at 9:10 AM by telephone due to the COVID-19 pandemic and verified that I am speaking with the correct person using two identifiers.   Consent: I discussed the limitations, risks, security and privacy concerns of performing an evaluation and management service by telephone and the availability of in person appointments. I also discussed with the patient that there may be a patient responsible charge related to this service. The patient expressed understanding and agreed to proceed.   Location of Patient: In his truck  Location of Provider: Clinic   Persons participating in Telemedicine visit: Izear Iffland Farrington-CMA Dr. Nelwyn Salisbury     History of Present Illness: Ricardo Forbes  is a 53 -year-old male with a history of type 2 diabetes mellitus diagnosed in 11/2003 (A1c 8.6), hyperlipidemia, hypothyroidism who comes into the clinic for an acute visit.  He complains of episodes of prolonged coughing when he gets too hot to the point where he is about to choke.  This resolves by drinking cold water or pouring cold water on his head and his face.  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 At his job he is required to wear long pants even during the hot months and is requesting a letter to allow him to wear shorts when the weather is hot. He denies passing out on those occasions but endorses fatigue.   Past Medical History:  Diagnosis Date  . Diabetes mellitus without complication (HCC)   . Thyroid disease    No Known Allergies  Current Outpatient Medications on File Prior to Visit  Medication Sig Dispense Refill  . Blood Glucose Monitoring Suppl (TRUE METRIX METER) DEVI 1 each by Does not apply route 3 (three) times daily before meals. 1 Device 0  . glipiZIDE (GLUCOTROL) 10 MG  tablet Take 1 tablet (10 mg total) by mouth 2 (two) times daily before a meal. TAKE ONE TABLET BY MOUTH TWICE DAILY BEFORE MEAL(S) 60 tablet 5  . glucose blood (TRUE METRIX BLOOD GLUCOSE TEST) test strip Use 3 times daily before meals 100 each 12  . Insulin Glargine (LANTUS SOLOSTAR) 100 UNIT/ML Solostar Pen Inject 30 Units into the skin 2 (two) times daily. 30 mL 5  . Insulin Pen Needle (B-D UF III MINI PEN NEEDLES) 31G X 5 MM MISC Use as directed once daily 100 each 0  . levothyroxine (SYNTHROID) 100 MCG tablet TAKE ONE TABLET BY MOUTH ONCE DAILY BEFORE BREAKFAST 30 tablet 3  . lisinopril (ZESTRIL) 2.5 MG tablet Take 1 tablet (2.5 mg total) by mouth daily. 30 tablet 5  . lovastatin (MEVACOR) 20 MG tablet Take 1 tablet (20 mg total) by mouth at bedtime. 30 tablet 5  . metFORMIN (GLUCOPHAGE XR) 500 MG 24 hr tablet Take 3 tablets in the morning and 2 tablets in the evening by mouth with food 120 tablet 5  . TRUEPLUS LANCETS 28G MISC 1 each by Does not apply route 3 (three) times daily before meals. 100 each 12   No current facility-administered medications on file prior to visit.     Observations/Objective: Awake, alert, oriented x3 Not in acute distress  Assessment and Plan: 1. Heat exhaustion, initial encounter Discussed measures to prevent heat exertion including staying hydrated, keeping a bottle of water in his truck His symptoms could be exacerbated due to abnormal  neurohormonal regulation due to his type 2 diabetes mellitus I provided him with a letter for his employer as requested   2. Type 2 diabetes mellitus without complication, unspecified whether long term insulin use (HCC) Uncontrolled Regimen discussed that last office visit 2 weeks ago   Follow Up Instructions: Keep previously scheduled appointment   I discussed the assessment and treatment plan with the patient. The patient was provided an opportunity to ask questions and all were answered. The patient agreed with the  plan and demonstrated an understanding of the instructions.   The patient was advised to call back or seek an in-person evaluation if the symptoms worsen or if the condition fails to improve as anticipated.     I provided 11 minutes total of non-face-to-face time during this encounter including median intraservice time, reviewing previous notes, labs, imaging, medications, management and patient verbalized understanding.     Ricardo RegisterEnobong Rhya Shan, MD, FAAFP. [facility] [place], [place] [phone]   06/16/2018, 9:10 AM

## 2018-06-16 NOTE — Progress Notes (Signed)
Patient has been called and DOB has been verified. Patient has been screened and transferred to PCP to start phone visit.  Patient states that his body get over heated at times and he starts to cough.. Patient states that this has been going on for years.

## 2018-06-17 NOTE — Patient Instructions (Signed)
Heat Exhaustion  Heat exhaustion happens when your body gets overheated from hot weather or from exercise. Heat exhaustion can lead to heat stroke, a life-threatening condition that requires emergency care.  Heat exhaustion is more likely to develop when:   You are exercising or being active.   You are in hot or humid weather.   You are in bright sunshine.   You are not drinking enough water.  It is important to take care of yourself and treat heat exhaustion as soon as possible. Untreated heat exhaustion can turn into heat stroke, which is a life-threatening condition that requires urgent medical treatment.  What increases the risk?  This condition is more likely to develop in:   People who exercise in hot or humid weather.   People who exercise beyond their fitness level.   People who wear clothing that does not allow sweat to evaporate.   People who are dehydrated.   People who drink a lot of alcoholic beverages or beverages that have caffeine. This can lead to dehydration.   People who are age 65 or older.   Children.   People who have a medical condition such as heart disease, poor circulation, sickle cell disease, or high blood pressure.   People who have a fever.   People who are very overweight (obese).  What are the signs or symptoms?  Symptoms of heat exhaustion include:   Heavy sweating along with feeling weak, dizzy, light-headed, and nauseous.   Rapid heartbeat.   Headache.   Urine that is darker than normal.   Muscle cramps, such as in the leg or side (flank).   Moist, cool, and clammy skin.   Fatigue.   Thirst.   Confusion.   Fainting.  Follow these instructions at home:  If you think that you have heat exhaustion, call your health care provider. Follow his or her instructions. You should also:   Call a friend or a family member and ask him or her to stay with you.   Move to a cooler location, such as:  ? Into the shade.  ? In front of a fan.  ? An air-conditioned  space.   Lie down and rest.   Slowly drink nonalcoholic, caffeine-free fluids.   Take off tight clothing or extra clothing.   Take a cool bath or shower, if possible. If you do not have access to a bath or shower, dab or mist cool water on your skin.  Contact a health care provider if:   Your symptoms last longer than 30 minutes.  Get help right away if:   You have any symptoms of heat stroke. These include:  ? Fever.  ? Vomiting.  ? Red skin.  ? Inability to sweat, resulting in hot, dry skin.  ? Excessive thirst.  ? Rapid breathing.  ? Headache.  ? Confusion or disorientation.  ? Fainting.  ? Seizures.  These symptoms may represent a serious problem that is an emergency. Do not wait to see if the symptoms will go away. Get medical help right away. Call your local emergency services (911 in the [place] Do not drive yourself to the hospital.  This information is not intended to replace advice given to you by your health care provider. Make sure you discuss any questions you have with your health care provider.  Document Released: 10/08/2007 Document Revised: 06/29/2016 Document Reviewed: 04/21/2015  [employer]vier Interactive Patient Education  2019 [employer]

## 2018-06-20 ENCOUNTER — Other Ambulatory Visit: Payer: Self-pay | Admitting: Pharmacist

## 2018-06-20 MED ORDER — METFORMIN HCL 500 MG PO TABS: ORAL_TABLET | ORAL | 2 refills | Status: DC

## 2018-06-21 ENCOUNTER — Telehealth: Payer: Self-pay | Admitting: Family Medicine

## 2018-06-21 NOTE — Telephone Encounter (Signed)
Patient called stating his employer would like to get a more detailed letter stating how hot it has to be for him to wear shorts. Patient states he is not in a controlled environment with tempeture's what time of year it has to be (season wise). Please follow up.

## 2018-06-21 NOTE — Telephone Encounter (Signed)
Letter is ready for pick-up.

## 2018-06-21 NOTE — Telephone Encounter (Signed)
New letter needs to state the temperature that patient is allowed to wear shorts at work.

## 2018-06-22 ENCOUNTER — Telehealth: Payer: Self-pay

## 2018-06-22 NOTE — Telephone Encounter (Signed)
Patient was called and informed of letter being ready for pick up.  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

## 2018-06-22 NOTE — Telephone Encounter (Signed)
I spoke with 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

## 2018-06-22 NOTE — Telephone Encounter (Signed)
Patient boss called clinic to request to speak with provider regarding letter that was written on 6/4 and 6/9. [name] phone number is [phone]

## 2018-06-29 ENCOUNTER — Telehealth: Payer: Self-pay | Admitting: Family Medicine

## 2018-06-29 NOTE — Telephone Encounter (Signed)
Paperwork has been received and patient will be informed once paperwork is ready for pick up.

## 2018-06-29 NOTE — Telephone Encounter (Signed)
Patient called to check on the status of paperwork that was sent over. Please follow up  Advised of 7-10 days processing times.

## 2018-07-01 ENCOUNTER — Encounter: Payer: Self-pay | Admitting: Family Medicine

## 2018-07-01 LAB — HM DIABETES EYE EXAM

## 2018-07-01 NOTE — Progress Notes (Signed)
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  Letter was provided and he called back again stating employer required specific temperatures under which he would wear shorts and specific months which I provided. Employer (Mr Orrin Brigham) called back to speak to me requesting rationale behind the letters and request which I explained to him occurred as per patient request; he had stated the truck was air-conditioned. I have received another correspondence from the patient requesting another letter stating he is able to perform his job responsibilities without any restrictions which I have provided and included an excerpt from my previous letter as he had requested wearing shorts as well due to the heat.  Letter will be faxed over to Victoriano Lain , fax [phone] as per patient request.

## 2018-08-17 ENCOUNTER — Ambulatory Visit: Payer: BLUE CROSS/BLUE SHIELD | Admitting: Family Medicine

## 2018-09-05 ENCOUNTER — Other Ambulatory Visit: Payer: Self-pay

## 2018-09-05 ENCOUNTER — Ambulatory Visit: Payer: BC Managed Care – PPO | Attending: Family Medicine | Admitting: Family Medicine

## 2018-09-05 ENCOUNTER — Encounter: Payer: Self-pay | Admitting: Family Medicine

## 2018-09-05 VITALS — BP 129/82 | HR 71 | Temp 98.2°F | Ht 70.0 in | Wt 179.0 lb

## 2018-09-05 DIAGNOSIS — E039 Hypothyroidism, unspecified: Secondary | ICD-10-CM

## 2018-09-05 DIAGNOSIS — I10 Essential (primary) hypertension: Secondary | ICD-10-CM | POA: Diagnosis not present

## 2018-09-05 DIAGNOSIS — E7800 Pure hypercholesterolemia, unspecified: Secondary | ICD-10-CM | POA: Diagnosis not present

## 2018-09-05 DIAGNOSIS — E119 Type 2 diabetes mellitus without complications: Secondary | ICD-10-CM

## 2018-09-05 DIAGNOSIS — Z1211 Encounter for screening for malignant neoplasm of colon: Secondary | ICD-10-CM | POA: Diagnosis not present

## 2018-09-05 LAB — GLUCOSE, POCT (MANUAL RESULT ENTRY): POC Glucose: 147 mg/dl — AB (ref 70–99)

## 2018-09-05 LAB — POCT GLYCOSYLATED HEMOGLOBIN (HGB A1C): HbA1c, POC (controlled diabetic range): 8.5 % — AB (ref 0.0–7.0)

## 2018-09-05 MED ORDER — LISINOPRIL 2.5 MG PO TABS: 2.5000 mg | ORAL_TABLET | Freq: Every day | ORAL | 5 refills | Status: DC

## 2018-09-05 MED ORDER — LOVASTATIN 20 MG PO TABS: 20.0000 mg | ORAL_TABLET | Freq: Every day | ORAL | 5 refills | Status: DC

## 2018-09-05 MED ORDER — CONTOUR NEXT TEST VI STRP: ORAL_STRIP | 12 refills | Status: AC

## 2018-09-05 MED ORDER — CONTOUR NEXT MONITOR W/DEVICE KIT: 1.0000 | PACK | Freq: Three times a day (TID) | 11 refills | Status: AC

## 2018-09-05 MED ORDER — LANTUS SOLOSTAR 100 UNIT/ML ~~LOC~~ SOPN: 12.0000 [IU] | PEN_INJECTOR | Freq: Two times a day (BID) | SUBCUTANEOUS | 5 refills | Status: DC

## 2018-09-05 MED ORDER — METFORMIN HCL 500 MG PO TABS: ORAL_TABLET | ORAL | 5 refills | Status: DC

## 2018-09-05 MED ORDER — GLIPIZIDE 10 MG PO TABS: 10.0000 mg | ORAL_TABLET | Freq: Two times a day (BID) | ORAL | 5 refills | Status: DC

## 2018-09-05 NOTE — Progress Notes (Signed)
Patient needs new meter and supplies.

## 2018-09-05 NOTE — Progress Notes (Signed)
Subjective:  Patient ID: Ricardo Forbes, male    DOB: [birth year]  Age: 53 y.o. MRN: [id number]  CC: Diabetes   HPI Reinhard Schack Ewald Beg  is a 42 -year-old male with a history of type 2 diabetes mellitus diagnosed in 11/2003 (A1c 8.6), hyperlipidemia, hypothyroidism seen for follow-up visit today. He has been administering 12 units of Lantus twice daily ;was previously prescribed 17 units twice daily but had a hard time keeping up with this as he preferred a once daily dose due to his work schedule.  This was changed to 30 units at bedtime but he has been administering 12 units twice daily and last night he had 24 units at night as he forgot his morning dose.  His last meal is usually at 8 PM and he does not take an evening snack. He has had hypoglycemic episodes at night of up to about 70.  Last night he had a hypoglycemic episode but did not have a glucometer to check his blood sugar. Requesting new glucometer today. Compliant with his statin, levothyroxine and his antihypertensive. He has no additional concerns today. Declines flu shot-states he always gets sick when he receives it.  Past Medical History:  Diagnosis Date  . Diabetes mellitus without complication ([place])   . Thyroid disease     History reviewed. No pertinent surgical history.  History reviewed. No pertinent family history.  No Known Allergies  Outpatient Medications Prior to Visit  Medication Sig Dispense Refill  . Insulin Pen Needle (B-D UF III MINI PEN NEEDLES) 31G X 5 MM MISC Use as directed once daily 100 each 0  . levothyroxine (SYNTHROID) 100 MCG tablet TAKE ONE TABLET BY MOUTH ONCE DAILY BEFORE BREAKFAST 30 tablet 3  . TRUEPLUS LANCETS 28G MISC 1 each by Does not apply route 3 (three) times daily before meals. 100 each 12  . Blood Glucose Monitoring Suppl (TRUE METRIX METER) DEVI 1 each by Does not apply route 3 (three) times daily before meals. 1 Device 0  . glipiZIDE (GLUCOTROL) 10 MG tablet  Take 1 tablet (10 mg total) by mouth 2 (two) times daily before a meal. TAKE ONE TABLET BY MOUTH TWICE DAILY BEFORE MEAL(S) 60 tablet 5  . glucose blood (TRUE METRIX BLOOD GLUCOSE TEST) test strip Use 3 times daily before meals 100 each 12  . Insulin Glargine (LANTUS SOLOSTAR) 100 UNIT/ML Solostar Pen Inject 30 Units into the skin 2 (two) times daily. 30 mL 5  . lisinopril (ZESTRIL) 2.5 MG tablet Take 1 tablet (2.5 mg total) by mouth daily. 30 tablet 5  . lovastatin (MEVACOR) 20 MG tablet Take 1 tablet (20 mg total) by mouth at bedtime. 30 tablet 5  . metFORMIN (GLUCOPHAGE) 500 MG tablet Take 3 tablets by mouth in the morning and 2 in the evening with food. 150 tablet 2   No facility-administered medications prior to visit.      ROS Review of Systems  Constitutional: Negative for activity change and appetite change.  HENT: Negative for sinus pressure and sore throat.   Eyes: Negative for visual disturbance.  Respiratory: Negative for cough, chest tightness and shortness of breath.   Cardiovascular: Negative for chest pain and leg swelling.  Gastrointestinal: Negative for abdominal distention, abdominal pain, constipation and diarrhea.  Endocrine: Negative.   Genitourinary: Negative for dysuria.  Musculoskeletal: Negative for joint swelling and myalgias.  Skin: Negative for rash.  Allergic/Immunologic: Negative.   Neurological: Negative for weakness, light-headedness and numbness.  Psychiatric/Behavioral: Negative for dysphoric  mood and suicidal ideas.    Objective:  BP 129/82   Pulse 71   Temp 98.2 F (36.8 C) (Oral)   Ht 5' 10"  (1.778 m)   Wt 179 lb (81.2 kg)   SpO2 96%   BMI 25.68 kg/m   BP/Weight 09/05/2018 0/04/8887 01/18/9448  Systolic BP 388 828 -  Diastolic BP 82 66 -  Wt. (Lbs) 179 - 172.84  BMI 25.68 - 24.8      Physical Exam Constitutional:      Appearance: He is well-developed.  Cardiovascular:     Rate and Rhythm: Normal rate.     Heart sounds: Normal  heart sounds. No murmur.  Pulmonary:     Effort: Pulmonary effort is normal.     Breath sounds: Normal breath sounds. No wheezing or rales.  Chest:     Chest wall: No tenderness.  Abdominal:     General: Bowel sounds are normal. There is no distension.     Palpations: Abdomen is soft. There is no mass.     Tenderness: There is no abdominal tenderness.  Musculoskeletal: Normal range of motion.  Neurological:     Mental Status: He is alert and oriented to person, place, and time.     CMP Latest Ref Rng & Units 02/17/2018 02/16/2018 08/16/2017  Glucose 70 - 99 mg/dL 156(H) 90 148(H)  BUN 6 - 20 mg/dL 17 14 15   Creatinine 0.61 - 1.24 mg/dL 1.35(H) 1.21 1.23  Sodium 135 - 145 mmol/L 139 141 142  Potassium 3.5 - 5.1 mmol/L 4.0 4.4 4.5  Chloride 98 - 111 mmol/L 105 101 104  CO2 22 - 32 mmol/L 25 20 24   Calcium 8.9 - 10.3 mg/dL 8.9 9.6 8.9  Total Protein 6.5 - 8.1 g/dL 7.4 - 6.6  Total Bilirubin 0.3 - 1.2 mg/dL 1.1 - 0.4  Alkaline Phos 38 - 126 U/L 63 - 75  AST 15 - 41 U/L 30 - 19  ALT 0 - 44 U/L 30 - 28    Lipid Panel     Component Value Date/Time   CHOL 155 08/16/2017 0940   TRIG 51 08/16/2017 0940   HDL 39 (L) 08/16/2017 0940   CHOLHDL 4.0 08/16/2017 0940   CHOLHDL 3.6 01/20/2016 0831   VLDL 14 02/21/2015 0900   LDLCALC 106 (H) 08/16/2017 0940   LDLDIRECT 115 (H) 04/30/2010 1052    CBC    Component Value Date/Time   WBC 7.3 02/17/2018 2115   RBC 4.78 02/17/2018 2115   HGB 14.4 02/17/2018 2115   HGB 14.6 12/14/2016 1124   HCT 45.0 02/17/2018 2115   HCT 43.2 12/14/2016 1124   PLT 249 02/17/2018 2115   PLT 282 12/14/2016 1124   MCV 94.1 02/17/2018 2115   MCV 91 12/14/2016 1124   MCH 30.1 02/17/2018 2115   MCHC 32.0 02/17/2018 2115   RDW 13.3 02/17/2018 2115   RDW 14.3 12/14/2016 1124   LYMPHSABS 1.5 12/14/2016 1124   MONOABS 0.2 01/11/2015 0953   EOSABS 0.2 12/14/2016 1124   BASOSABS 0.0 12/14/2016 1124    Lab Results  Component Value Date   HGBA1C 8.5 (A)  09/05/2018    Assessment & Plan:   1. Type 2 diabetes mellitus without complication, unspecified whether long term insulin use (HCC) Uncontrolled with A1c of 8.5 Tendency to have hyperglycemia precludes increase in dose of Lantus Advised to administer 3 tablets of metformin in the morning and 2 tablets in the evening rather than 2 tablets twice daily. Counseled  on Diabetic diet, my plate method, 237 minutes of moderate intensity exercise/week Keep blood sugar logs with fasting goals of 80-120 mg/dl, random of less than 180 and in the event of sugars less than 60 mg/dl or greater than 400 mg/dl please notify the clinic ASAP. It is recommended that you undergo annual eye exams and annual foot exams. Pneumonia vaccine is recommended. - POCT glucose (manual entry) - POCT glycosylated hemoglobin (Hb A1C) - Microalbumin/Creatinine Ratio, Urine - Insulin Glargine (LANTUS SOLOSTAR) 100 UNIT/ML Solostar Pen; Inject 12 Units into the skin 2 (two) times daily.  Dispense: 30 mL; Refill: 5 - glipiZIDE (GLUCOTROL) 10 MG tablet; Take 1 tablet (10 mg total) by mouth 2 (two) times daily before a meal. TAKE ONE TABLET BY MOUTH TWICE DAILY BEFORE MEAL(S)  Dispense: 60 tablet; Refill: 5 - metFORMIN (GLUCOPHAGE) 500 MG tablet; Take 3 tablets by mouth in the morning and 2 in the evening with food.  Dispense: 150 tablet; Refill: 5 - Blood Glucose Monitoring Suppl (CONTOUR NEXT MONITOR) w/Device KIT; 1 each by Does not apply route 3 (three) times daily.  Dispense: 1 kit; Refill: 11 - glucose blood (CONTOUR NEXT TEST) test strip; Use as instructed  Dispense: 100 each; Refill: 12  2. HYPERTENSION, BENIGN ESSENTIAL Controlled Counseled on blood pressure goal of less than 130/80, low-sodium, DASH diet, medication compliance, 150 minutes of moderate intensity exercise per week. Discussed medication compliance, adverse effects. - lisinopril (ZESTRIL) 2.5 MG tablet; Take 1 tablet (2.5 mg total) by mouth daily.  Dispense:  30 tablet; Refill: 5  3. Pure hypercholesterolemia Controlled Low-cholesterol diet - lovastatin (MEVACOR) 20 MG tablet; Take 1 tablet (20 mg total) by mouth at bedtime.  Dispense: 30 tablet; Refill: 5  4. Screening for colon cancer - Microalbumin/Creatinine Ratio, Urine - Ambulatory referral to Gastroenterology  5. Hypothyroidism, unspecified type - T4, free - TSH    Meds ordered this encounter  Medications  . Insulin Glargine (LANTUS SOLOSTAR) 100 UNIT/ML Solostar Pen    Sig: Inject 12 Units into the skin 2 (two) times daily.    Dispense:  30 mL    Refill:  5    Discontinue previous dose  . glipiZIDE (GLUCOTROL) 10 MG tablet    Sig: Take 1 tablet (10 mg total) by mouth 2 (two) times daily before a meal. TAKE ONE TABLET BY MOUTH TWICE DAILY BEFORE MEAL(S)    Dispense:  60 tablet    Refill:  5  . metFORMIN (GLUCOPHAGE) 500 MG tablet    Sig: Take 3 tablets by mouth in the morning and 2 in the evening with food.    Dispense:  150 tablet    Refill:  5  . lisinopril (ZESTRIL) 2.5 MG tablet    Sig: Take 1 tablet (2.5 mg total) by mouth daily.    Dispense:  30 tablet    Refill:  5  . lovastatin (MEVACOR) 20 MG tablet    Sig: Take 1 tablet (20 mg total) by mouth at bedtime.    Dispense:  30 tablet    Refill:  5  . Blood Glucose Monitoring Suppl (CONTOUR NEXT MONITOR) w/Device KIT    Sig: 1 each by Does not apply route 3 (three) times daily.    Dispense:  1 kit    Refill:  11    Please dispense lancets as covered by insurance  . glucose blood (CONTOUR NEXT TEST) test strip    Sig: Use as instructed    Dispense:  100 each  Refill:  12    Follow-up: Return in about 3 months (around 12/06/2018) for medical conditions.       Charlott Rakes, MD, FAAFP. [facility] and [place], [place]   09/05/2018, 5:35 PM

## 2018-09-06 ENCOUNTER — Other Ambulatory Visit: Payer: Self-pay | Admitting: Family Medicine

## 2018-09-06 DIAGNOSIS — E039 Hypothyroidism, unspecified: Secondary | ICD-10-CM

## 2018-09-06 LAB — MICROALBUMIN / CREATININE URINE RATIO
Creatinine, Urine: 206.4 mg/dL
Microalb/Creat Ratio: 4 mg/g creat (ref 0–29)
Microalbumin, Urine: 7.8 ug/mL

## 2018-09-06 LAB — TSH: TSH: 1.45 u[IU]/mL (ref 0.450–4.500)

## 2018-09-06 LAB — T4, FREE: Free T4: 1.43 ng/dL (ref 0.82–1.77)

## 2018-09-06 MED ORDER — LEVOTHYROXINE SODIUM 100 MCG PO TABS: ORAL_TABLET | ORAL | 3 refills | Status: DC

## 2018-09-07 ENCOUNTER — Encounter: Payer: Self-pay | Admitting: Gastroenterology

## 2018-09-14 MED FILL — !LANTUS SOLOSTAR 100UNITS/M: 100 | 14 days supply | Qty: 9 | Fill #1

## 2018-09-27 ENCOUNTER — Other Ambulatory Visit: Payer: Self-pay

## 2018-09-27 ENCOUNTER — Ambulatory Visit (AMBULATORY_SURGERY_CENTER): Payer: Self-pay | Admitting: *Deleted

## 2018-09-27 VITALS — Temp 97.1°F | Ht 70.0 in | Wt 180.0 lb

## 2018-09-27 DIAGNOSIS — Z1211 Encounter for screening for malignant neoplasm of colon: Secondary | ICD-10-CM

## 2018-09-27 MED ORDER — PEG 3350-KCL-NA BICARB-NACL 420 G PO SOLR: 4000.0000 mL | Freq: Once | ORAL | 0 refills | Status: AC

## 2018-09-27 NOTE — Progress Notes (Signed)
No egg or soy allergy known to patient  No issues with past sedation with any surgeries  or procedures, no intubation problems  No diet pills per patient No home 02 use per patient  No blood thinners per patient  Pt denies issues with constipation  No A fib or A flutter  EMMI video sent to pt's e mail  

## 2018-09-28 ENCOUNTER — Encounter: Payer: Self-pay | Admitting: Gastroenterology

## 2018-10-07 ENCOUNTER — Telehealth: Payer: Self-pay | Admitting: Gastroenterology

## 2018-10-07 NOTE — Telephone Encounter (Signed)

## 2018-10-07 NOTE — Telephone Encounter (Signed)
Pt returned call and answered “No” to all questions.  °  °Pt made aware of that care partner may come to the lobby during the procedure but will need to provide their own mask. ° ° °

## 2018-10-10 ENCOUNTER — Encounter: Payer: Self-pay | Admitting: Gastroenterology

## 2018-10-10 ENCOUNTER — Other Ambulatory Visit: Payer: Self-pay

## 2018-10-10 ENCOUNTER — Ambulatory Visit (AMBULATORY_SURGERY_CENTER): Payer: BC Managed Care – PPO | Admitting: Gastroenterology

## 2018-10-10 VITALS — BP 100/60 | HR 57 | Temp 98.3°F | Resp 15 | Ht 70.0 in | Wt 180.0 lb

## 2018-10-10 DIAGNOSIS — Z1211 Encounter for screening for malignant neoplasm of colon: Secondary | ICD-10-CM

## 2018-10-10 MED ORDER — SODIUM CHLORIDE 0.9 % IV SOLN: 500.0000 mL | Freq: Once | INTRAVENOUS | Status: DC

## 2018-10-10 NOTE — Op Note (Signed)
[facility] Patient Name: Ricardo AmabileLeander Furukawa Procedure Date: 10/10/2018 9:05 AM MRN: [id number] Endoscopist: Rachael Feeaniel P Maxxwell Edgett , MD Age: [age] Referring MD:  Date of Birth: 09-10-1965 Gender: Male Account #: [id number] Procedure:                Colonoscopy Indications:              Screening for colorectal malignant neoplasm Medicines:                Monitored Anesthesia Care Procedure:                Pre-Anesthesia Assessment:                           - Prior to the procedure, a History and Physical                            was performed, and patient medications and                            allergies were reviewed. The patient's tolerance of                            previous anesthesia was also reviewed. The risks                            and benefits of the procedure and the sedation                            options and risks were discussed with the patient.                            All questions were answered, and informed consent                            was obtained. Prior Anticoagulants: The patient has                            taken no previous anticoagulant or antiplatelet                            agents. ASA Grade Assessment: II - A patient with                            mild systemic disease. After reviewing the risks                            and benefits, the patient was deemed in                            satisfactory condition to undergo the procedure.                           After obtaining informed consent, the colonoscope  was passed under direct vision. Throughout the                            procedure, the patient's blood pressure, pulse, and                            oxygen saturations were monitored continuously. The                            Colonoscope was introduced through the anus and                            advanced to the the cecum, identified by                            appendiceal orifice and  ileocecal valve. The                            colonoscopy was performed without difficulty. The                            patient tolerated the procedure well. The quality                            of the bowel preparation was good. The ileocecal                            valve, appendiceal orifice, and rectum were                            photographed. Scope In: 9:10:12 AM Scope Out: 9:23:11 AM Scope Withdrawal Time: 0 hours 7 minutes 43 seconds  Total Procedure Duration: 0 hours 12 minutes 59 seconds  Findings:                 The entire examined colon appeared normal on direct                            and retroflexion views. Complications:            No immediate complications. Estimated blood loss:                            None. Estimated Blood Loss:     Estimated blood loss: none. Impression:               - The entire examined colon is normal on direct and                            retroflexion views.                           - No polyps or cancers. Recommendation:           - Patient has a contact number available for  emergencies. The signs and symptoms of potential                            delayed complications were discussed with the                            patient. Return to normal activities tomorrow.                            Written discharge instructions were provided to the                            patient.                           - Resume previous diet.                           - Continue present medications.                           - Repeat colonoscopy in 10 years for screening. Milus Banister, MD 10/10/2018 9:25:23 AM This report has been signed electronically.

## 2018-10-10 NOTE — Patient Instructions (Signed)
Thank-you for choosing [place] for your healthcare needs today.  YOU HAD AN ENDOSCOPIC PROCEDURE TODAY AT [facility]:   Refer to the procedure report that was given to you for any specific questions about what was found during the examination.  If the procedure report does not answer your questions, please call your gastroenterologist to clarify.  If you requested that your care partner not be given the details of your procedure findings, then the procedure report has been included in a sealed envelope for you to review at your convenience later.  YOU SHOULD EXPECT: Some feelings of bloating in the abdomen. Passage of more gas than usual.  Walking can help get rid of the air that was put into your GI tract during the procedure and reduce the bloating. If you had a lower endoscopy (such as a colonoscopy or flexible sigmoidoscopy) you may notice spotting of blood in your stool or on the toilet paper. If you underwent a bowel prep for your procedure, you may not have a normal bowel movement for a few days.  Please Note:  You might notice some irritation and congestion in your nose or some drainage.  This is from the oxygen used during your procedure.  There is no need for concern and it should clear up in a day or so.  SYMPTOMS TO REPORT IMMEDIATELY:   Following lower endoscopy (colonoscopy or flexible sigmoidoscopy):  Excessive amounts of blood in the stool  Significant tenderness or worsening of abdominal pains  Swelling of the abdomen that is new, acute  Fever of 100F or higher   For urgent or emergent issues, a gastroenterologist can be reached at any hour by calling [phone].   DIET:  We do recommend a small meal at first, but then you may proceed to your regular diet.  Drink plenty of fluids but you should avoid alcoholic beverages for 24 hours.  ACTIVITY:  You should plan to take it easy for the rest of today and you should NOT DRIVE or use heavy machinery until  tomorrow (because of the sedation medicines used during the test).    FOLLOW UP: Our staff will call the number listed on your records 48-72 hours following your procedure to check on you and address any questions or concerns that you may have regarding the information given to you following your procedure. If we do not reach you, we will leave a message.  We will attempt to reach you two times.  During this call, we will ask if you have developed any symptoms of COVID 19. If you develop any symptoms (ie: fever, flu-like symptoms, shortness of breath, cough etc.) before then, please call [phone].  If you test positive for Covid 19 in the 2 weeks post procedure, please call and report this information to [place].     SIGNATURES/CONFIDENTIALITY: You and/or your care partner have signed paperwork which will be entered into your electronic medical record.  These signatures attest to the fact that that the information above on your After Visit Summary has been reviewed and is understood.  Full responsibility of the confidentiality of this discharge information lies with you and/or your care-partner.

## 2018-10-10 NOTE — Progress Notes (Signed)
Temp check by KA/vital check by CW.  Patient states no changes in medical or surgical history since pre-visit screening on 09/27/18. 

## 2018-10-10 NOTE — Progress Notes (Signed)
To PACU, VSS. Report to [email or web address] 

## 2018-10-12 ENCOUNTER — Telehealth: Payer: Self-pay | Admitting: *Deleted

## 2018-10-12 NOTE — Telephone Encounter (Signed)
Have you developed a fever since your procedure? no 2.   Have you had an respiratory symptoms (SOB or cough) since your procedure? no  3.   Have you tested positive for COVID 19 since your procedure no  4.   Have you had any family members/close contacts diagnosed with the COVID 19 since your procedure?  no   If yes to any of these questions please route to Joylene John, RN and Alphonsa Gin, Therapist, sports.  Follow up Call-  Call back number 10/10/2018  Post procedure Call Back phone  # [phone]  Permission to leave phone message Yes  Some recent data might be hidden     Patient questions:  Do you have a fever, pain , or abdominal swelling? No. Pain Score  0 *  Have you tolerated food without any problems? Yes.    Have you been able to return to your normal activities? Yes.    Do you have any questions about your discharge instructions: Diet   No. Medications  No. Follow up visit  No.  Do you have questions or concerns about your Care? No.  Actions: * If pain score is 4 or above: No action needed, pain <4.

## 2018-12-07 ENCOUNTER — Ambulatory Visit: Payer: BC Managed Care – PPO | Admitting: Family Medicine

## 2018-12-12 ENCOUNTER — Other Ambulatory Visit: Payer: Self-pay

## 2018-12-12 ENCOUNTER — Ambulatory Visit: Payer: BC Managed Care – PPO | Attending: Family Medicine

## 2018-12-12 ENCOUNTER — Other Ambulatory Visit: Payer: Self-pay | Admitting: Family Medicine

## 2018-12-12 ENCOUNTER — Encounter: Payer: Self-pay | Admitting: Family Medicine

## 2018-12-12 DIAGNOSIS — Z794 Long term (current) use of insulin: Secondary | ICD-10-CM

## 2018-12-12 DIAGNOSIS — E039 Hypothyroidism, unspecified: Secondary | ICD-10-CM | POA: Diagnosis not present

## 2018-12-12 DIAGNOSIS — E119 Type 2 diabetes mellitus without complications: Secondary | ICD-10-CM | POA: Diagnosis not present

## 2018-12-13 LAB — CMP14+EGFR
ALT: 24 IU/L (ref 0–44)
AST: 21 IU/L (ref 0–40)
Albumin/Globulin Ratio: 1.7 (ref 1.2–2.2)
Albumin: 4.3 g/dL (ref 3.8–4.9)
Alkaline Phosphatase: 96 IU/L (ref 39–117)
BUN/Creatinine Ratio: 11 (ref 9–20)
BUN: 14 mg/dL (ref 6–24)
Bilirubin Total: 0.4 mg/dL (ref 0.0–1.2)
CO2: 24 mmol/L (ref 20–29)
Calcium: 9.3 mg/dL (ref 8.7–10.2)
Chloride: 100 mmol/L (ref 96–106)
Creatinine, Ser: 1.29 mg/dL — ABNORMAL HIGH (ref 0.76–1.27)
GFR calc Af Amer: 74 mL/min/{1.73_m2} (ref >59)
GFR calc non Af Amer: 64 mL/min/{1.73_m2} (ref >59)
Globulin, Total: 2.5 g/dL (ref 1.5–4.5)
Glucose: 221 mg/dL — ABNORMAL HIGH (ref 65–99)
Potassium: 4.5 mmol/L (ref 3.5–5.2)
Sodium: 137 mmol/L (ref 134–144)
Total Protein: 6.8 g/dL (ref 6.0–8.5)

## 2018-12-13 LAB — LIPID PANEL
Chol/HDL Ratio: 3.5 ratio (ref 0.0–5.0)
Cholesterol, Total: 140 mg/dL (ref 100–199)
HDL: 40 mg/dL (ref >39)
LDL Chol Calc (NIH): 89 mg/dL (ref 0–99)
Triglycerides: 52 mg/dL (ref 0–149)
VLDL Cholesterol Cal: 11 mg/dL (ref 5–40)

## 2018-12-13 LAB — T4, FREE: Free T4: 1.47 ng/dL (ref 0.82–1.77)

## 2018-12-13 LAB — TSH: TSH: 3.48 u[IU]/mL (ref 0.450–4.500)

## 2018-12-14 ENCOUNTER — Ambulatory Visit: Payer: BC Managed Care – PPO | Attending: Family Medicine | Admitting: Family Medicine

## 2018-12-14 ENCOUNTER — Other Ambulatory Visit: Payer: Self-pay

## 2018-12-19 ENCOUNTER — Ambulatory Visit: Payer: BC Managed Care – PPO | Attending: Family Medicine | Admitting: Internal Medicine

## 2018-12-19 ENCOUNTER — Encounter: Payer: Self-pay | Admitting: Internal Medicine

## 2018-12-19 DIAGNOSIS — E7800 Pure hypercholesterolemia, unspecified: Secondary | ICD-10-CM

## 2018-12-19 DIAGNOSIS — E119 Type 2 diabetes mellitus without complications: Secondary | ICD-10-CM | POA: Diagnosis not present

## 2018-12-19 DIAGNOSIS — E039 Hypothyroidism, unspecified: Secondary | ICD-10-CM

## 2018-12-19 MED ORDER — LOVASTATIN 20 MG PO TABS: 20.0000 mg | ORAL_TABLET | Freq: Every day | ORAL | 5 refills | Status: DC

## 2018-12-19 MED ORDER — GLIPIZIDE 10 MG PO TABS: 10.0000 mg | ORAL_TABLET | Freq: Two times a day (BID) | ORAL | 5 refills | Status: AC

## 2018-12-19 MED ORDER — LANTUS SOLOSTAR 100 UNIT/ML ~~LOC~~ SOPN: 12.0000 [IU] | PEN_INJECTOR | Freq: Two times a day (BID) | SUBCUTANEOUS | 5 refills | Status: DC

## 2018-12-19 MED FILL — LANTUS SOLOSTAR 100 UNITS/M: 100 | 25 days supply | Qty: 6 | Fill #0

## 2018-12-19 NOTE — Progress Notes (Signed)
Medication RF 

## 2018-12-19 NOTE — Progress Notes (Signed)
Virtual Visit via Telephone Note  I connected with Ricardo Forbes on 12/19/18 at  2:30 PM EST by telephone and verified that I am speaking with the correct person using two identifiers.  Location: Patient: work Provider: home   I discussed the limitations, risks, security and privacy concerns of performing an evaluation and management service by telephone and the availability of in person appointments. I also discussed with the patient that there may be a patient responsible charge related to this service. The patient expressed understanding and agreed to proceed.   History of Present Illness: Pt req rx refill.  Admits to missing 1 dose of thyroid med prior to labs, otherwise takes meds as prescribed.  Fasting F.s. 170-180 range, does not check f.s. in afternoon.  Reports occas hypoglycemia at night when sleeping. Denies polydipsia, polyuria, c/p, sob, dizziness, vision changes.  No change in wt, no fatigue,   Observations/Objective: Reviewed labs Assessment and Plan: 1. Type 2 diabetes mellitus without complication, unspecified whether long term insulin use (HCC) F.s. above goal, discussed need to monitor f.s. in afternoon, may need increase dose of insulin to improve dm control and fasting f.s. and bedtime snack to prevent hypoglycemia, plan hba1c in 3 mos - Insulin Glargine (LANTUS SOLOSTAR) 100 UNIT/ML Solostar Pen; Inject 12 Units into the skin 2 (two) times daily.  Dispense: 30 mL; Refill: 5 - glipiZIDE (GLUCOTROL) 10 MG tablet; Take 1 tablet (10 mg total) by mouth 2 (two) times daily before a meal. TAKE ONE TABLET BY MOUTH TWICE DAILY BEFORE MEAL(S)  Dispense: 60 tablet; Refill: 5  2. Pure hypercholesterolemia LDL at goal, continue mevacor - lovastatin (MEVACOR) 20 MG tablet; Take 1 tablet (20 mg total) by mouth at bedtime.  Dispense: 30 tablet; Refill: 5  3. Hypothyroidism, unspecified type TSH trending upward, monitor for now, plan rpt in 3 months  Follow Up  Instructions:    I discussed the assessment and treatment plan with the patient. The patient was provided an opportunity to ask questions and all were answered. The patient agreed with the plan and demonstrated an understanding of the instructions.   The patient was advised to call back or seek an in-person evaluation if the symptoms worsen or if the condition fails to improve as anticipated.  I provided 20 minutes of non-face-to-face time during this encounter.   Kimber Relic, MD

## 2019-01-02 ENCOUNTER — Telehealth: Payer: Self-pay | Admitting: Family Medicine

## 2019-01-02 NOTE — Telephone Encounter (Signed)
Please schedule a telehealth visit  

## 2019-01-02 NOTE — Telephone Encounter (Signed)
Will route to PCP for review. 

## 2019-01-02 NOTE — Telephone Encounter (Signed)
patient called his hand and his fingers have been getting numb at the tips

## 2019-01-03 NOTE — Telephone Encounter (Signed)
Patient was called and informed of appointment time and date.

## 2019-01-24 ENCOUNTER — Other Ambulatory Visit: Payer: Self-pay

## 2019-01-24 ENCOUNTER — Ambulatory Visit: Payer: BC Managed Care – PPO | Attending: Family Medicine | Admitting: Family Medicine

## 2019-01-24 DIAGNOSIS — G5601 Carpal tunnel syndrome, right upper limb: Secondary | ICD-10-CM | POA: Diagnosis not present

## 2019-01-24 DIAGNOSIS — I10 Essential (primary) hypertension: Secondary | ICD-10-CM | POA: Diagnosis not present

## 2019-01-24 DIAGNOSIS — E7800 Pure hypercholesterolemia, unspecified: Secondary | ICD-10-CM

## 2019-01-24 DIAGNOSIS — E039 Hypothyroidism, unspecified: Secondary | ICD-10-CM

## 2019-01-24 DIAGNOSIS — E119 Type 2 diabetes mellitus without complications: Secondary | ICD-10-CM | POA: Diagnosis not present

## 2019-01-24 MED ORDER — MELOXICAM 7.5 MG PO TABS: 7.5000 mg | ORAL_TABLET | Freq: Every day | ORAL | 2 refills | Status: DC

## 2019-01-24 MED ORDER — METFORMIN HCL 500 MG PO TABS: ORAL_TABLET | ORAL | 5 refills | Status: AC

## 2019-01-24 MED ORDER — LEVOTHYROXINE SODIUM 100 MCG PO TABS: ORAL_TABLET | ORAL | 3 refills | Status: DC

## 2019-01-24 MED ORDER — LANTUS SOLOSTAR 100 UNIT/ML ~~LOC~~ SOPN: 14.0000 [IU] | PEN_INJECTOR | Freq: Two times a day (BID) | SUBCUTANEOUS | 5 refills | Status: AC

## 2019-01-24 MED ORDER — LOVASTATIN 20 MG PO TABS: 20.0000 mg | ORAL_TABLET | Freq: Every day | ORAL | 5 refills | Status: AC

## 2019-01-24 MED ORDER — LISINOPRIL 2.5 MG PO TABS: 2.5000 mg | ORAL_TABLET | Freq: Every day | ORAL | 5 refills | Status: AC

## 2019-01-24 MED FILL — LANTUS SOLOSTAR 100 UNITS/M: 100 | 32 days supply | Qty: 9 | Fill #0

## 2019-01-24 NOTE — Progress Notes (Signed)
Virtual Visit via Telephone Note  I connected with Ricardo Forbes, on 01/24/2019 at 2:15 PM by telephone due to the COVID-19 pandemic and verified that I am speaking with the correct person using two identifiers.   Consent: I discussed the limitations, risks, security and privacy concerns of performing an evaluation and management service by telephone and the availability of in person appointments. I also discussed with the patient that there may be a patient responsible charge related to this service. The patient expressed understanding and agreed to proceed.   Location of Patient: Work  Biomedical scientist of Provider: Clinic   Persons participating in Telemedicine visit: Tenny Craw Far[place]-CMA Dr. Margarita Rana     History of Present Illness: Ricardo Forbes  is a 54 -year-old male with a history of type 2 diabetes mellitus diagnosed in 11/2003 (A1c 8.6), hyperlipidemia, hypothyroidism seen for follow-up visit today   Blood sugars are 160 and under 200 after meals but he is unable to check fasting sugars due to leaving the house early. He has not had any hypoglycemia recently but in the past had hypoglycemia with increased dose of Lantus. Last meal of the day is at 9pm. He cuts back on desserts and is trying to eat right.   His hands get numb while driving and also at night when he sleeps with his right upper extremity bent at his elbow.  This occurs mostly in his right hand and he drives mostly with his R hand but he is left handed.  Symptoms are more in his index finger thumb and middle finger. Compliant with Levothyroxine and last thyroid panel was normal. Past Medical History:  Diagnosis Date  . Diabetes mellitus without complication ([place])   . Hyperlipidemia    past hx- currently on medication   . Hypertension    currently being treated and under control   . Thyroid disease    No Known Allergies  Current Outpatient Medications on File Prior to Visit   Medication Sig Dispense Refill  . Blood Glucose Monitoring Suppl (CONTOUR NEXT MONITOR) w/Device KIT 1 each by Does not apply route 3 (three) times daily. 1 kit 11  . glipiZIDE (GLUCOTROL) 10 MG tablet Take 1 tablet (10 mg total) by mouth 2 (two) times daily before a meal. TAKE ONE TABLET BY MOUTH TWICE DAILY BEFORE MEAL(S) 60 tablet 5  . glucose blood (CONTOUR NEXT TEST) test strip Use as instructed 100 each 12  . Insulin Glargine (LANTUS SOLOSTAR) 100 UNIT/ML Solostar Pen Inject 12 Units into the skin 2 (two) times daily. 30 mL 5  . Insulin Pen Needle (B-D UF III MINI PEN NEEDLES) 31G X 5 MM MISC Use as directed once daily 100 each 0  . levothyroxine (SYNTHROID) 100 MCG tablet TAKE ONE TABLET BY MOUTH ONCE DAILY BEFORE BREAKFAST 30 tablet 3  . lisinopril (ZESTRIL) 2.5 MG tablet Take 1 tablet (2.5 mg total) by mouth daily. 30 tablet 5  . lovastatin (MEVACOR) 20 MG tablet Take 1 tablet (20 mg total) by mouth at bedtime. 30 tablet 5  . metFORMIN (GLUCOPHAGE) 500 MG tablet Take 3 tablets by mouth in the morning and 2 in the evening with food. (Patient taking differently: Take 2 tablets by mouth in the morning and 2 in the evening with food.) 150 tablet 5  . TRUEPLUS LANCETS 28G MISC 1 each by Does not apply route 3 (three) times daily before meals. 100 each 12   No current facility-administered medications on file prior to visit.  Observations/Objective: Awake, alert, oriented x3 Not in acute distress  Lab Results  Component Value Date   HGBA1C 8.5 (A) 09/05/2018    Lab Results  Component Value Date   TSH 3.480 12/12/2018    Assessment and Plan: 1. Type 2 diabetes mellitus without complication, unspecified whether long term insulin use (HCC) Uncontrolled with A1c of 8.5 Increase Lantus to 14 units bid Advise to reduce by 2 units bid if hypoglycemia occurs Counseled on Diabetic diet, my plate method, 479 minutes of moderate intensity exercise/week Blood sugar logs with fasting  goals of 80-120 mg/dl, random of less than 180 and in the event of sugars less than 60 mg/dl or greater than 400 mg/dl encouraged to notify the clinic. Advised on the need for annual eye exams, annual foot exams, Pneumonia vaccine. - Insulin Glargine (LANTUS SOLOSTAR) 100 UNIT/ML Solostar Pen; Inject 14 Units into the skin 2 (two) times daily.  Dispense: 30 mL; Refill: 5 - metFORMIN (GLUCOPHAGE) 500 MG tablet; Take 3 tablets by mouth in the morning and 2 in the evening with food.  Dispense: 150 tablet; Refill: 5  2. Acquired hypothyroidism Controlled - levothyroxine (SYNTHROID) 100 MCG tablet; TAKE ONE TABLET BY MOUTH ONCE DAILY BEFORE BREAKFAST  Dispense: 30 tablet; Refill: 3  3. HYPERTENSION, BENIGN ESSENTIAL Stable Counseled on blood pressure goal of less than 130/80, low-sodium, DASH diet, medication compliance, 150 minutes of moderate intensity exercise per week. Discussed medication compliance, adverse effects. - lisinopril (ZESTRIL) 2.5 MG tablet; Take 1 tablet (2.5 mg total) by mouth daily.  Dispense: 30 tablet; Refill: 5  4. Pure hypercholesterolemia Controlled - lovastatin (MEVACOR) 20 MG tablet; Take 1 tablet (20 mg total) by mouth at bedtime.  Dispense: 30 tablet; Refill: 5  5. Carpal tunnel syndrome of right wrist Advised to use wrist brace Commence NSAIDS If unrelenting consider oral steroids, referral for corticosteroid injection - meloxicam (MOBIC) 7.5 MG tablet; Take 1 tablet (7.5 mg total) by mouth daily.  Dispense: 30 tablet; Refill: 2   Follow Up Instructions: 3 months   I discussed the assessment and treatment plan with the patient. The patient was provided an opportunity to ask questions and all were answered. The patient agreed with the plan and demonstrated an understanding of the instructions.   The patient was advised to call back or seek an in-person evaluation if the symptoms worsen or if the condition fails to improve as anticipated.     I provided 25  minutes total of non-face-to-face time during this encounter including median intraservice time, reviewing previous notes, investigations, ordering medications, medical decision making, coordinating care and patient verbalized understanding at the end of the visit.     Charlott Rakes, MD, FAAFP. [facility] [place], [place]   01/24/2019, 2:15 PM

## 2019-01-24 NOTE — Progress Notes (Signed)
Patient has been called and DOB has been verified. Patient has been screened and transferred to PCP to start phone visit.     

## 2019-03-23 ENCOUNTER — Ambulatory Visit: Payer: Self-pay

## 2019-06-05 ENCOUNTER — Other Ambulatory Visit: Payer: Self-pay | Admitting: Family Medicine

## 2019-06-05 DIAGNOSIS — E039 Hypothyroidism, unspecified: Secondary | ICD-10-CM

## 2019-06-09 ENCOUNTER — Other Ambulatory Visit: Payer: Self-pay

## 2019-06-09 DIAGNOSIS — E039 Hypothyroidism, unspecified: Secondary | ICD-10-CM

## 2019-06-09 MED ORDER — LEVOTHYROXINE SODIUM 100 MCG PO TABS: ORAL_TABLET | ORAL | 0 refills | Status: AC

## 2019-06-09 NOTE — Telephone Encounter (Signed)
1) Medication(s) Requested (by name): levothyroxine  2) Pharmacy of Choice: [address]  3) Special Requests:    Pt has been out of med for 4 days.  Pt has appt with Amy NP 06/29/19   Approved medications will be sent to the pharmacy, we will reach out if there is an issue.  Requests made after 3pm may not be addressed until the following business day!  If a patient is unsure of the name of the medication(s) please note and ask patient to call back when they are able to provide all info, do not send to responsible party until all information is available!

## 2019-06-09 NOTE — Telephone Encounter (Signed)
Rx sent 

## 2019-06-19 DIAGNOSIS — I10 Essential (primary) hypertension: Secondary | ICD-10-CM | POA: Diagnosis not present

## 2019-06-19 DIAGNOSIS — Z794 Long term (current) use of insulin: Secondary | ICD-10-CM | POA: Diagnosis not present

## 2019-06-19 DIAGNOSIS — E7849 Other hyperlipidemia: Secondary | ICD-10-CM | POA: Diagnosis not present

## 2019-06-19 DIAGNOSIS — E039 Hypothyroidism, unspecified: Secondary | ICD-10-CM | POA: Diagnosis not present

## 2019-06-19 DIAGNOSIS — E1165 Type 2 diabetes mellitus with hyperglycemia: Secondary | ICD-10-CM | POA: Diagnosis not present

## 2019-06-27 NOTE — Progress Notes (Deleted)
Patient ID: Ricardo Forbes, male    DOB: [birth year]  MRN: [id number]  CC: Hypothyroidism Follow-Up  Subjective: Ricardo Forbes is a 54 y.o. male with history of essential hypertension, hypothyroidism, diabetes mellitus, erectile dysfunction associated with type 2 diabetes mellitus, peripheral neuropathy, and hyperlipidemia who presents for hypothyroidism follow-up.  1. HYPOTHYROIDISM FOLLOW-UP:  Thyroid control status:{Blank single:19197::"controlled","uncontrolled","better","worse","exacerbated","stable"} Satisfied with current treatment? {Blank single:19197::"yes","no"} Medication side effects: {Blank single:19197::"yes","no"} Medication compliance: {Blank single:19197::"excellent compliance","good compliance","fair compliance","poor compliance"} Etiology of hypothyroidism:  Recent dose adjustment:{Blank single:19197::"yes","no"} Fatigue: {Blank single:19197::"yes","no"} Cold intolerance: {Blank single:19197::"yes","no"} Heat intolerance: {Blank single:19197::"yes","no"} Weight gain: {Blank single:19197::"yes","no"} Weight loss: {Blank single:19197::"yes","no"} Constipation: {Blank single:19197::"yes","no"} Diarrhea/loose stools: {Blank single:19197::"yes","no"} Palpitations: {Blank single:19197::"yes","no"} Lower extremity edema: {Blank single:19197::"yes","no"} Anxiety/depressed mood: {Blank single:19197::"yes","no"}   Last visit 01/24/2019 with Dr. Margarita Rana. During that encounter hypothyroidism controlled. Levothyroxine refilled.  2. HYPERTENSION FOLLOW-UP: Currently taking: see medication list Have you taken your blood pressure medication today: []  Yes []  No  Med Adherence: []  Yes    []  No Medication side effects: []  Yes    []  No Adherence with salt restriction: []  Yes    []  No Exercise: Yes []  No []  Home Monitoring?: []  Yes    []  No Monitoring Frequency: []  Yes    []  No Home BP results range: []  Yes    []  No Smoking []  Yes []  No SOB? []  Yes    []  No Chest Pain?: []   Yes    []  No Leg swelling?: []  Yes    []  No Headaches?: []  Yes    []  No Dizziness? []  Yes    []  No Comments: Last visit 01/24/2019 with Dr. Margarita Rana. During that encounter blood pressure stable. Lisinopril continued.  3. DIABETES TYPE 2 FOLLOW-UP: Last A1C:   Are you fasting today: []  Yes []  No  Have you taken your anti-diabetic medications today: []  Yes []  No  Med Adherence:  []  Yes    []  No Medication side effects:  []  Yes    []  No Home Monitoring?  []  Yes    []  No Home glucose results range:*** Diet Adherence: []  Yes    []  No Exercise: []  Yes    []  No Hypoglycemic episodes?: []  Yes    []  No Numbness of the feet? []  Yes    []  No Retinopathy hx? []  Yes    []  No Last eye exam:  Comments: Last visit 01/24/2019 with Dr. Margarita Rana. During that encounter diabetes uncontrolled. Lantus increased to 14 units twice daily. Metformin continued.  4. CHOLESTEROL FOLLOW-UP: Last Lipid Panel results:  HDL  Date Value Ref Range Status  12/12/2018 40 >39 mg/dL Final   Triglycerides  Date Value Ref Range Status  12/12/2018 52 0 - 149 mg/dL Final    Are you fasting today: []  Yes []  No Med Adherence: []  Yes    []  No Medication side effects: []  Yes    []  No Muscle aches:  []  Yes    []  No Diet Adherence: []  Yes    []  No Comments: Last visit 01/24/2019 with Dr. Margarita Rana. During that encounter cholesterol controlled. Lovastatin continued.    Patient Active Problem List   Diagnosis Date Noted  . Erectile dysfunction associated with type 2 diabetes mellitus ([place]) 05/30/2010  . ABSCESS, TOOTH 12/04/2009  . Diabetes ([place]) 06/14/2009  . Hyperlipidemia 02/11/2009  . Hypothyroidism 02/01/2009  . Diabetes mellitus ([place]) 02/01/2009  . PERIPHERAL NEUROPATHY 02/01/2009  . BORDERLINE GLAUCOMA WITH OCULAR HYPERTENSION 02/01/2009  . HYPERTENSION, BENIGN ESSENTIAL 02/01/2009  . UNSPECIFIED  DENTAL CARIES 02/01/2009     Current Outpatient Medications on File Prior to Visit  Medication Sig Dispense Refill  .  Blood Glucose Monitoring Suppl (CONTOUR NEXT MONITOR) w/Device KIT 1 each by Does not apply route 3 (three) times daily. 1 kit 11  . glipiZIDE (GLUCOTROL) 10 MG tablet Take 1 tablet (10 mg total) by mouth 2 (two) times daily before a meal. TAKE ONE TABLET BY MOUTH TWICE DAILY BEFORE MEAL(S) 60 tablet 5  . glucose blood (CONTOUR NEXT TEST) test strip Use as instructed 100 each 12  . Insulin Glargine (LANTUS SOLOSTAR) 100 UNIT/ML Solostar Pen Inject 14 Units into the skin 2 (two) times daily. 30 mL 5  . Insulin Pen Needle (B-D UF III MINI PEN NEEDLES) 31G X 5 MM MISC Use as directed once daily 100 each 0  . levothyroxine (SYNTHROID) 100 MCG tablet TAKE ONE TABLET BY MOUTH ONCE DAILY BEFORE BREAKFAST 30 tablet 0  . lisinopril (ZESTRIL) 2.5 MG tablet Take 1 tablet (2.5 mg total) by mouth daily. 30 tablet 5  . lovastatin (MEVACOR) 20 MG tablet Take 1 tablet (20 mg total) by mouth at bedtime. 30 tablet 5  . meloxicam (MOBIC) 7.5 MG tablet Take 1 tablet (7.5 mg total) by mouth daily. 30 tablet 2  . metFORMIN (GLUCOPHAGE) 500 MG tablet Take 3 tablets by mouth in the morning and 2 in the evening with food. 150 tablet 5  . TRUEPLUS LANCETS 28G MISC 1 each by Does not apply route 3 (three) times daily before meals. 100 each 12   No current facility-administered medications on file prior to visit.    No Known Allergies  Social History   Socioeconomic History  . Marital status: Married    Spouse name: Not on file  . Number of children: Not on file  . Years of education: Not on file  . Highest education level: Not on file  Occupational History  . Not on file  Tobacco Use  . Smoking status: Never Smoker  . Smokeless tobacco: Never Used  Vaping Use  . Vaping Use: Never used  Substance and Sexual Activity  . Alcohol use: No  . Drug use: No  . Sexual activity: Not on file  Other Topics Concern  . Not on file  Social History Narrative  . Not on file   Social Determinants of Health   Financial  Resource Strain:   . Difficulty of Paying Living Expenses:   Food Insecurity:   . Worried About Charity fundraiser in the Last Year:   . Arboriculturist in the Last Year:   Transportation Needs:   . Film/video editor (Medical):   Marland Kitchen Lack of Transportation (Non-Medical):   Physical Activity:   . Days of Exercise per Week:   . Minutes of Exercise per Session:   Stress:   . Feeling of Stress :   Social Connections:   . Frequency of Communication with Friends and Family:   . Frequency of Social Gatherings with Friends and Family:   . Attends Religious Services:   . Active Member of Clubs or Organizations:   . Attends Archivist Meetings:   Marland Kitchen Marital Status:   Intimate Partner Violence:   . Fear of Current or Ex-Partner:   . Emotionally Abused:   Marland Kitchen Physically Abused:   . Sexually Abused:     Family History  Problem Relation Age of Onset  . Colon cancer Neg Hx   . Colon polyps Neg  Hx   . Esophageal cancer Neg Hx   . Rectal cancer Neg Hx   . Stomach cancer Neg Hx     Past Surgical History:  Procedure Laterality Date  . DENTAL SURGERY      ROS: Review of Systems Negative except as stated above  PHYSICAL EXAM: There were no vitals taken for this visit.  Physical Exam  {male adult master:310786} {male adult master:310785}  CMP Latest Ref Rng & Units 12/12/2018 02/17/2018 02/16/2018  Glucose 65 - 99 mg/dL 221(H) 156(H) 90  BUN 6 - 24 mg/dL 14 17 14   Creatinine 0.76 - 1.27 mg/dL 1.29(H) 1.35(H) 1.21  Sodium 134 - 144 mmol/L 137 139 141  Potassium 3.5 - 5.2 mmol/L 4.5 4.0 4.4  Chloride 96 - 106 mmol/L 100 105 101  CO2 20 - 29 mmol/L 24 25 20   Calcium 8.7 - 10.2 mg/dL 9.3 8.9 9.6  Total Protein 6.0 - 8.5 g/dL 6.8 7.4 -  Total Bilirubin 0.0 - 1.2 mg/dL 0.4 1.1 -  Alkaline Phos 39 - 117 IU/L 96 63 -  AST 0 - 40 IU/L 21 30 -  ALT 0 - 44 IU/L 24 30 -   Lipid Panel     Component Value Date/Time   CHOL 140 12/12/2018 0856   TRIG 52 12/12/2018 0856    HDL 40 12/12/2018 0856   CHOLHDL 3.5 12/12/2018 0856   CHOLHDL 3.6 01/20/2016 0831   VLDL 14 02/21/2015 0900   LDLCALC 89 12/12/2018 0856   LDLDIRECT 115 (H) 04/30/2010 1052    CBC    Component Value Date/Time   WBC 7.3 02/17/2018 2115   RBC 4.78 02/17/2018 2115   HGB 14.4 02/17/2018 2115   HGB 14.6 12/14/2016 1124   HCT 45.0 02/17/2018 2115   HCT 43.2 12/14/2016 1124   PLT 249 02/17/2018 2115   PLT 282 12/14/2016 1124   MCV 94.1 02/17/2018 2115   MCV 91 12/14/2016 1124   MCH 30.1 02/17/2018 2115   MCHC 32.0 02/17/2018 2115   RDW 13.3 02/17/2018 2115   RDW 14.3 12/14/2016 1124   LYMPHSABS 1.5 12/14/2016 1124   MONOABS 0.2 01/11/2015 0953   EOSABS 0.2 12/14/2016 1124   BASOSABS 0.0 12/14/2016 1124    ASSESSMENT AND PLAN:  There are no diagnoses linked to this encounter.   Patient was given the opportunity to ask questions.  Patient verbalized understanding of the plan and was able to repeat key elements of the plan.   No orders of the defined types were placed in this encounter.    Requested Prescriptions    No prescriptions requested or ordered in this encounter    No follow-ups on file.  Camillia Herter, NP

## 2019-06-28 MED FILL — LANTUS 100 UNITS/ML VIAL: 100 | 80 days supply | Qty: 40 | Fill #0

## 2019-06-29 ENCOUNTER — Ambulatory Visit: Payer: Self-pay | Admitting: Family

## 2019-08-01 DIAGNOSIS — Z794 Long term (current) use of insulin: Secondary | ICD-10-CM | POA: Diagnosis not present

## 2019-08-01 DIAGNOSIS — E039 Hypothyroidism, unspecified: Secondary | ICD-10-CM | POA: Diagnosis not present

## 2019-08-01 DIAGNOSIS — E1165 Type 2 diabetes mellitus with hyperglycemia: Secondary | ICD-10-CM | POA: Diagnosis not present

## 2019-08-01 DIAGNOSIS — I10 Essential (primary) hypertension: Secondary | ICD-10-CM | POA: Diagnosis not present

## 2019-08-01 DIAGNOSIS — E7849 Other hyperlipidemia: Secondary | ICD-10-CM | POA: Diagnosis not present

## 2019-08-15 ENCOUNTER — Other Ambulatory Visit: Payer: Self-pay

## 2019-08-15 ENCOUNTER — Ambulatory Visit
Admission: EM | Admit: 2019-08-15 | Discharge: 2019-08-15 | Disposition: A | Discharge status: Home / Self Care | Payer: BC Managed Care – PPO | Attending: Physician Assistant | Admitting: Physician Assistant

## 2019-08-15 ENCOUNTER — Telehealth: Payer: Self-pay

## 2019-08-15 DIAGNOSIS — R05 Cough: Secondary | ICD-10-CM | POA: Diagnosis not present

## 2019-08-15 DIAGNOSIS — R059 Cough, unspecified: Secondary | ICD-10-CM

## 2019-08-15 DIAGNOSIS — R0981 Nasal congestion: Secondary | ICD-10-CM

## 2019-08-15 MED ORDER — FLUTICASONE PROPIONATE 50 MCG/ACT NA SUSP: 2.0000 | Freq: Every day | NASAL | 0 refills | Status: AC

## 2019-08-15 MED ORDER — FLUTICASONE PROPIONATE 50 MCG/ACT NA SUSP
2.0000 | Freq: Every day | NASAL | 0 refills | Status: DC
Start: 2019-08-15 — End: 2019-08-15

## 2019-08-15 MED ORDER — BENZONATATE 200 MG PO CAPS: 200.0000 mg | ORAL_CAPSULE | Freq: Three times a day (TID) | ORAL | 0 refills | Status: DC

## 2019-08-15 MED ORDER — BENZONATATE 200 MG PO CAPS
200.0000 mg | ORAL_CAPSULE | Freq: Three times a day (TID) | ORAL | 0 refills | Status: DC
Start: 2019-08-15 — End: 2019-08-15

## 2019-08-15 MED ORDER — IPRATROPIUM BROMIDE 0.06 % NA SOLN: 2.0000 | Freq: Four times a day (QID) | NASAL | 0 refills | Status: AC

## 2019-08-15 MED ORDER — IPRATROPIUM BROMIDE 0.06 % NA SOLN
2.0000 | Freq: Four times a day (QID) | NASAL | 0 refills | Status: DC
Start: 2019-08-15 — End: 2019-08-15

## 2019-08-15 MED FILL — IPRATROPIUM 0.06% SPRAY: 0.06 | 20 days supply | Qty: 15 | Fill #0

## 2019-08-15 NOTE — ED Provider Notes (Signed)
[facility]    CSN: [id number] Arrival date & time: 08/15/19  1311      History   Chief Complaint Chief Complaint  Patient presents with  . Cough    HPI Ricardo Forbes is a 54 y.o. male.   54 year old male comes in for 3 day of URI symptoms. Rhinorrhea, cough, nasal congestion, sore throat. Occasional productive cough. Denies fever, chills, body aches. Denies abdominal pain, nausea, vomiting, diarrhea. Denies shortness of breath, loss of taste/smell. Never smoker. COVID vaccinated. On known exposure.   Last a1c 8.3     Past Medical History:  Diagnosis Date  . Diabetes mellitus without complication ([place])   . Hyperlipidemia    past hx- currently on medication   . Hypertension    currently being treated and under control   . Thyroid disease     Patient Active Problem List   Diagnosis Date Noted  . Erectile dysfunction associated with type 2 diabetes mellitus ([place]) 05/30/2010  . ABSCESS, TOOTH 12/04/2009  . Diabetes ([place]) 06/14/2009  . Hyperlipidemia 02/11/2009  . Hypothyroidism 02/01/2009  . Diabetes mellitus ([place]) 02/01/2009  . PERIPHERAL NEUROPATHY 02/01/2009  . BORDERLINE GLAUCOMA WITH OCULAR HYPERTENSION 02/01/2009  . HYPERTENSION, BENIGN ESSENTIAL 02/01/2009  . UNSPECIFIED DENTAL CARIES 02/01/2009    Past Surgical History:  Procedure Laterality Date  . DENTAL SURGERY         Home Medications    Prior to Admission medications   Medication Sig Start Date End Date Taking? Authorizing Provider  benzonatate (TESSALON) 200 MG capsule Take 1 capsule (200 mg total) by mouth every 8 (eight) hours. 08/15/19   Tasia Catchings, Amy V, PA-C  Blood Glucose Monitoring Suppl (CONTOUR NEXT MONITOR) w/Device KIT 1 each by Does not apply route 3 (three) times daily. 09/05/18   Charlott Rakes, MD  fluticasone (FLONASE) 50 MCG/ACT nasal spray Place 2 sprays into both nostrils daily. 08/15/19   Tasia Catchings, Amy V, PA-C  glipiZIDE (GLUCOTROL) 10 MG tablet Take 1 tablet (10 mg total) by  mouth 2 (two) times daily before a meal. TAKE ONE TABLET BY MOUTH TWICE DAILY BEFORE MEAL(S) 12/19/18   Kimber Relic, MD  glucose blood (CONTOUR NEXT TEST) test strip Use as instructed 09/05/18   Charlott Rakes, MD  Insulin Glargine (LANTUS SOLOSTAR) 100 UNIT/ML Solostar Pen Inject 14 Units into the skin 2 (two) times daily. 01/24/19   Charlott Rakes, MD  Insulin Pen Needle (B-D UF III MINI PEN NEEDLES) 31G X 5 MM MISC Use as directed once daily 12/15/16   Charlott Rakes, MD  ipratropium (ATROVENT) 0.06 % nasal spray Place 2 sprays into both nostrils 4 (four) times daily. 08/15/19   Tasia Catchings, Amy V, PA-C  levothyroxine (SYNTHROID) 100 MCG tablet TAKE ONE TABLET BY MOUTH ONCE DAILY BEFORE BREAKFAST 06/09/19   Charlott Rakes, MD  lisinopril (ZESTRIL) 2.5 MG tablet Take 1 tablet (2.5 mg total) by mouth daily. 01/24/19   Charlott Rakes, MD  lovastatin (MEVACOR) 20 MG tablet Take 1 tablet (20 mg total) by mouth at bedtime. 01/24/19   Charlott Rakes, MD  metFORMIN (GLUCOPHAGE) 500 MG tablet Take 3 tablets by mouth in the morning and 2 in the evening with food. 01/24/19   Charlott Rakes, MD  TRUEPLUS LANCETS 28G MISC 1 each by Does not apply route 3 (three) times daily before meals. 03/15/17   Charlott Rakes, MD    Family History Family History  Problem Relation Age of Onset  . Colon cancer Neg Hx   .  Colon polyps Neg Hx   . Esophageal cancer Neg Hx   . Rectal cancer Neg Hx   . Stomach cancer Neg Hx     Social History Social History   Tobacco Use  . Smoking status: Never Smoker  . Smokeless tobacco: Never Used  Vaping Use  . Vaping Use: Never used  Substance Use Topics  . Alcohol use: No  . Drug use: No     Allergies   Patient has no known allergies.   Review of Systems Review of Systems  Reason unable to perform ROS: See HPI as above.     Physical Exam Triage Vital Signs ED Triage Vitals [08/15/19 1404]  Enc Vitals Group     BP 109/69     Pulse Rate 73     Resp 16     Temp  98.5 F (36.9 C)     Temp Source Oral     SpO2 96 %     Weight      Height      Head Circumference      Peak Flow      Pain Score 0     Pain Loc      Pain Edu?      Excl. in [place]?    No data found.  Updated Vital Signs BP 109/69 (BP Location: Left Arm)   Pulse 73   Temp 98.5 F (36.9 C) (Oral)   Resp 16   SpO2 96%   Physical Exam Constitutional:      General: He is not in acute distress.    Appearance: Normal appearance. He is not ill-appearing, toxic-appearing or diaphoretic.  HENT:     Head: Normocephalic and atraumatic.     Nose:     Right Sinus: No maxillary sinus tenderness or frontal sinus tenderness.     Left Sinus: No maxillary sinus tenderness or frontal sinus tenderness.     Mouth/Throat:     Mouth: Mucous membranes are moist.     Pharynx: Oropharynx is clear. Uvula midline.  Cardiovascular:     Rate and Rhythm: Normal rate and regular rhythm.     Heart sounds: Normal heart sounds. No murmur heard.  No friction rub. No gallop.   Pulmonary:     Effort: Pulmonary effort is normal. No accessory muscle usage, prolonged expiration, respiratory distress or retractions.     Comments: Lungs clear to auscultation without adventitious lung sounds. Musculoskeletal:     Cervical back: Normal range of motion and neck supple.  Neurological:     General: No focal deficit present.     Mental Status: He is alert and oriented to person, place, and time.      UC Treatments / Results  Labs (all labs ordered are listed, but only abnormal results are displayed) Labs Reviewed  NOVEL CORONAVIRUS, NAA    EKG   Radiology No results found.  Procedures Procedures (including critical care time)  Medications Ordered in UC Medications - No data to display  Initial Impression / Assessment and Plan / UC Course  I have reviewed the triage vital signs and the nursing notes.  Pertinent labs & imaging results that were available during my care of the patient were reviewed  by me and considered in my medical decision making (see chart for details).    COVID PCR test ordered. Patient to quarantine until testing results return. No alarming signs on exam. LCTAB. Symptomatic treatment discussed.  Push fluids.  Return precautions given.  Patient expresses  understanding and agrees to plan.  Final Clinical Impressions(s) / UC Diagnoses   Final diagnoses:  Cough  Nasal congestion    ED Prescriptions    Medication Sig Dispense Auth. Provider   benzonatate (TESSALON) 200 MG capsule Take 1 capsule (200 mg total) by mouth every 8 (eight) hours. 21 capsule Izea Livolsi V, PA-C   fluticasone (FLONASE) 50 MCG/ACT nasal spray Place 2 sprays into both nostrils daily. 1 g Maelle Sheaffer V, PA-C   ipratropium (ATROVENT) 0.06 % nasal spray Place 2 sprays into both nostrils 4 (four) times daily. 15 mL Ok Edwards, PA-C     PDMP not reviewed this encounter.   Ok Edwards, PA-C 08/15/19 1448

## 2019-08-15 NOTE — ED Triage Notes (Signed)
Pt c/o productive cough with clear/yellow mucus, nasal congestion, and sore throat since Sunday.

## 2019-08-15 NOTE — Discharge Instructions (Signed)
COVID PCR testing ordered. I would like you to quarantine until testing results. Tessalon for cough. Start flonase, atrovent nasal spray for nasal congestion/drainage. You can use over the counter nasal saline rinse such as neti pot for nasal congestion. Keep hydrated, your urine should be clear to pale yellow in color. Tylenol/motrin for fever and pain. Monitor for any worsening of symptoms, chest pain, shortness of breath, wheezing, swelling of the throat, go to the emergency department for further evaluation needed.   

## 2019-08-17 LAB — NOVEL CORONAVIRUS, NAA

## 2019-08-18 NOTE — Telephone Encounter (Signed)
Patient called in and requested for a note to return to work from recent urgent care visit. Patient requested for it to be posted to mychart. Please follow up at your earliest convenience.

## 2019-09-19 DIAGNOSIS — E039 Hypothyroidism, unspecified: Secondary | ICD-10-CM | POA: Diagnosis not present

## 2019-09-19 DIAGNOSIS — Z794 Long term (current) use of insulin: Secondary | ICD-10-CM | POA: Diagnosis not present

## 2019-09-19 DIAGNOSIS — E7849 Other hyperlipidemia: Secondary | ICD-10-CM | POA: Diagnosis not present

## 2019-09-19 DIAGNOSIS — I10 Essential (primary) hypertension: Secondary | ICD-10-CM | POA: Diagnosis not present

## 2019-09-19 DIAGNOSIS — E1165 Type 2 diabetes mellitus with hyperglycemia: Secondary | ICD-10-CM | POA: Diagnosis not present

## 2019-10-30 DIAGNOSIS — Z794 Long term (current) use of insulin: Secondary | ICD-10-CM | POA: Diagnosis not present

## 2019-10-30 DIAGNOSIS — E7849 Other hyperlipidemia: Secondary | ICD-10-CM | POA: Diagnosis not present

## 2019-10-30 DIAGNOSIS — Z0000 Encounter for general adult medical examination without abnormal findings: Secondary | ICD-10-CM | POA: Diagnosis not present

## 2019-10-30 DIAGNOSIS — I10 Essential (primary) hypertension: Secondary | ICD-10-CM | POA: Diagnosis not present

## 2019-10-30 DIAGNOSIS — E1165 Type 2 diabetes mellitus with hyperglycemia: Secondary | ICD-10-CM | POA: Diagnosis not present

## 2022-01-19 ENCOUNTER — Emergency Department (HOSPITAL_BASED_OUTPATIENT_CLINIC_OR_DEPARTMENT_OTHER)
Admission: EM | Admit: 2022-01-19 | Discharge: 2022-01-19 | Disposition: A | Discharge status: Home / Self Care | Payer: No Typology Code available for payment source | Attending: Emergency Medicine | Admitting: Emergency Medicine

## 2022-01-19 ENCOUNTER — Other Ambulatory Visit: Payer: Self-pay

## 2022-01-19 ENCOUNTER — Encounter (HOSPITAL_BASED_OUTPATIENT_CLINIC_OR_DEPARTMENT_OTHER): Payer: Self-pay

## 2022-01-19 DIAGNOSIS — R059 Cough, unspecified: Secondary | ICD-10-CM | POA: Diagnosis present

## 2022-01-19 DIAGNOSIS — Z7984 Long term (current) use of oral hypoglycemic drugs: Secondary | ICD-10-CM | POA: Insufficient documentation

## 2022-01-19 DIAGNOSIS — J101 Influenza due to other identified influenza virus with other respiratory manifestations: Secondary | ICD-10-CM | POA: Diagnosis not present

## 2022-01-19 DIAGNOSIS — Z79899 Other long term (current) drug therapy: Secondary | ICD-10-CM | POA: Diagnosis not present

## 2022-01-19 DIAGNOSIS — Z20822 Contact with and (suspected) exposure to covid-19: Secondary | ICD-10-CM | POA: Diagnosis not present

## 2022-01-19 DIAGNOSIS — M549 Dorsalgia, unspecified: Secondary | ICD-10-CM | POA: Diagnosis not present

## 2022-01-19 DIAGNOSIS — X500XXA Overexertion from strenuous movement or load, initial encounter: Secondary | ICD-10-CM | POA: Diagnosis not present

## 2022-01-19 DIAGNOSIS — E119 Type 2 diabetes mellitus without complications: Secondary | ICD-10-CM | POA: Diagnosis not present

## 2022-01-19 DIAGNOSIS — Y990 Civilian activity done for income or pay: Secondary | ICD-10-CM | POA: Insufficient documentation

## 2022-01-19 DIAGNOSIS — Z794 Long term (current) use of insulin: Secondary | ICD-10-CM | POA: Insufficient documentation

## 2022-01-19 DIAGNOSIS — E039 Hypothyroidism, unspecified: Secondary | ICD-10-CM | POA: Diagnosis not present

## 2022-01-19 LAB — COMPREHENSIVE METABOLIC PANEL
ALT: 18 U/L (ref 0–44)
AST: 21 U/L (ref 15–41)
Albumin: 3.7 g/dL (ref 3.5–5.0)
Alkaline Phosphatase: 74 U/L (ref 38–126)
Anion gap: 10 (ref 5–15)
BUN: 13 mg/dL (ref 6–20)
CO2: 23 mmol/L (ref 22–32)
Calcium: 8.5 mg/dL — ABNORMAL LOW (ref 8.9–10.3)
Chloride: 103 mmol/L (ref 98–111)
Creatinine, Ser: 1.22 mg/dL (ref 0.61–1.24)
GFR, Estimated: >60 mL/min (ref >60)
Glucose, Bld: 171 mg/dL — ABNORMAL HIGH (ref 70–99)
Potassium: 4.1 mmol/L (ref 3.5–5.1)
Sodium: 136 mmol/L (ref 135–145)
Total Bilirubin: 0.8 mg/dL (ref 0.3–1.2)
Total Protein: 6.8 g/dL (ref 6.5–8.1)

## 2022-01-19 LAB — URINALYSIS, ROUTINE W REFLEX MICROSCOPIC
Bilirubin Urine: NEGATIVE
Glucose, UA: NEGATIVE mg/dL
Hgb urine dipstick: NEGATIVE
Ketones, ur: 15 mg/dL — AB
Leukocytes,Ua: NEGATIVE
Nitrite: NEGATIVE
Protein, ur: NEGATIVE mg/dL
Specific Gravity, Urine: >=1.03 (ref 1.005–1.030)
pH: 5.5 (ref 5.0–8.0)

## 2022-01-19 LAB — CBC WITH DIFFERENTIAL/PLATELET
Abs Immature Granulocytes: 0.01 10*3/uL (ref 0.00–0.07)
Basophils Absolute: 0 10*3/uL (ref 0.0–0.1)
Basophils Relative: 1 %
Eosinophils Absolute: 0.1 10*3/uL (ref 0.0–0.5)
Eosinophils Relative: 2 %
HCT: 39.9 % (ref 39.0–52.0)
Hemoglobin: 13.3 g/dL (ref 13.0–17.0)
Immature Granulocytes: 0 %
Lymphocytes Relative: 6 %
Lymphs Abs: 0.3 10*3/uL — ABNORMAL LOW (ref 0.7–4.0)
MCH: 31 pg (ref 26.0–34.0)
MCHC: 33.3 g/dL (ref 30.0–36.0)
MCV: 93 fL (ref 80.0–100.0)
Monocytes Absolute: 0.6 10*3/uL (ref 0.1–1.0)
Monocytes Relative: 12 %
Neutro Abs: 4.2 10*3/uL (ref 1.7–7.7)
Neutrophils Relative %: 79 %
Platelets: 223 10*3/uL (ref 150–400)
RBC: 4.29 MIL/uL (ref 4.22–5.81)
RDW: 13.7 % (ref 11.5–15.5)
WBC: 5.3 10*3/uL (ref 4.0–10.5)
nRBC: 0 % (ref 0.0–0.2)

## 2022-01-19 LAB — RESP PANEL BY RT-PCR (RSV, FLU A&B, COVID)  RVPGX2
Influenza A by PCR: POSITIVE — AB
Influenza B by PCR: NEGATIVE
Resp Syncytial Virus by PCR: NEGATIVE
SARS Coronavirus 2 by RT PCR: NEGATIVE

## 2022-01-19 LAB — TSH: TSH: 0.269 u[IU]/mL — ABNORMAL LOW (ref 0.350–4.500)

## 2022-01-19 MED ORDER — ACETAMINOPHEN 500 MG PO TABS: 1000.0000 mg | ORAL_TABLET | Freq: Once | ORAL | Status: DC

## 2022-01-19 MED ORDER — SODIUM CHLORIDE 0.9 % IV BOLUS
1000.0000 mL | Freq: Once | INTRAVENOUS | Status: AC
  Administered 2022-01-19: 1000 mL via INTRAVENOUS

## 2022-01-19 MED ORDER — KETOROLAC TROMETHAMINE 30 MG/ML IJ SOLN
30.0000 mg | Freq: Once | INTRAMUSCULAR | Status: AC
  Administered 2022-01-19: 30 mg via INTRAVENOUS
  Filled 2022-01-19: qty 1

## 2022-01-19 MED ORDER — OSELTAMIVIR PHOSPHATE 75 MG PO CAPS: 75.0000 mg | ORAL_CAPSULE | Freq: Two times a day (BID) | ORAL | 0 refills | Status: DC

## 2022-01-19 MED ORDER — ACETAMINOPHEN 325 MG PO TABS
650.0000 mg | ORAL_TABLET | Freq: Once | ORAL | Status: AC | PRN
  Administered 2022-01-19: 650 mg via ORAL
  Filled 2022-01-19: qty 2

## 2022-01-19 NOTE — ED Triage Notes (Signed)
Pt reports lower back pain, onset today. Denies injury. He also states he thinks he might have covid due to cough, fever/chills, body aches and has felt fatigued.

## 2022-01-19 NOTE — ED Notes (Signed)
Introduced myself to patient. Wife at bedside. Patient is laying  in bed. Encouraged patient to provide a urine sample. Gave patient something to drink.

## 2022-01-19 NOTE — ED Provider Notes (Signed)
[facility] Provider Note   CSN: [id number] Arrival date & time: 01/19/22  0029     History  Chief Complaint  Patient presents with   Back Pain    Ricardo Forbes is a 57 y.o. male.  Patient is a 57 year old male with past medical history of hypothyroidism, diabetes, hyperlipidemia.  Patient presenting today with complaints of back pain.  This started earlier today in the absence of any specific injury or trauma.  He also reports some cough, fever, chills, and fatigue and is concerned he may have COVID.  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  He denies any weakness or numbness of the extremities.  He denies any urinary complaints.  The history is provided by the patient.       Home Medications Prior to Admission medications   Medication Sig Start Date End Date Taking? Authorizing Provider  benzonatate (TESSALON) 200 MG capsule Take 1 capsule (200 mg total) by mouth every 8 (eight) hours. 08/15/19   Tasia Catchings, Amy V, PA-C  Blood Glucose Monitoring Suppl (CONTOUR NEXT MONITOR) w/Device KIT 1 each by Does not apply route 3 (three) times daily. 09/05/18   Charlott Rakes, MD  fluticasone (FLONASE) 50 MCG/ACT nasal spray Place 2 sprays into both nostrils daily. 08/15/19   Tasia Catchings, Amy V, PA-C  glipiZIDE (GLUCOTROL) 10 MG tablet Take 1 tablet (10 mg total) by mouth 2 (two) times daily before a meal. TAKE ONE TABLET BY MOUTH TWICE DAILY BEFORE MEAL(S) 12/19/18   Kimber Relic, MD  glucose blood (CONTOUR NEXT TEST) test strip Use as instructed 09/05/18   Charlott Rakes, MD  Insulin Glargine (LANTUS SOLOSTAR) 100 UNIT/ML Solostar Pen Inject 14 Units into the skin 2 (two) times daily. 01/24/19   Charlott Rakes, MD  Insulin Pen Needle (B-D UF III MINI PEN NEEDLES) 31G X 5 MM MISC Use as directed once daily 12/15/16   Charlott Rakes, MD  ipratropium (ATROVENT) 0.06 % nasal spray Place 2 sprays into both nostrils 4 (four) times daily. 08/15/19   Tasia Catchings,  Amy V, PA-C  levothyroxine (SYNTHROID) 100 MCG tablet TAKE ONE TABLET BY MOUTH ONCE DAILY BEFORE BREAKFAST 06/09/19   Charlott Rakes, MD  lisinopril (ZESTRIL) 2.5 MG tablet Take 1 tablet (2.5 mg total) by mouth daily. 01/24/19   Charlott Rakes, MD  lovastatin (MEVACOR) 20 MG tablet Take 1 tablet (20 mg total) by mouth at bedtime. 01/24/19   Charlott Rakes, MD  metFORMIN (GLUCOPHAGE) 500 MG tablet Take 3 tablets by mouth in the morning and 2 in the evening with food. 01/24/19   Charlott Rakes, MD  TRUEPLUS LANCETS 28G MISC 1 each by Does not apply route 3 (three) times daily before meals. 03/15/17   Charlott Rakes, MD      Allergies    Patient has no known allergies.    Review of Systems   Review of Systems  All other systems reviewed and are negative.   Physical Exam Updated Vital Signs BP 110/72 (BP Location: Right Arm)   Pulse 100   Temp (!) 100.8 F (38.2 C) (Oral)   Resp 17   Ht 5\' 10"  (1.778 m)   Wt 74.8 kg   SpO2 95%   BMI 23.68 kg/m  Physical Exam Vitals and nursing note reviewed.  Constitutional:      General: He is not in acute distress.    Appearance: He is well-developed. He is not diaphoretic.  HENT:  Head: Normocephalic and atraumatic.     Mouth/Throat:     Mouth: Mucous membranes are moist.     Pharynx: No oropharyngeal exudate or posterior oropharyngeal erythema.  Cardiovascular:     Rate and Rhythm: Normal rate and regular rhythm.     Heart sounds: No murmur heard.    No friction rub.  Pulmonary:     Effort: Pulmonary effort is normal. No respiratory distress.     Breath sounds: Normal breath sounds. No wheezing or rales.  Abdominal:     General: Bowel sounds are normal. There is no distension.     Palpations: Abdomen is soft.     Tenderness: There is no abdominal tenderness.  Musculoskeletal:        General: Normal range of motion.     Cervical back: Normal range of motion and neck supple.     Comments: There is tenderness to palpation of the  soft tissues of the lumbar region.  Skin:    General: Skin is warm and dry.  Neurological:     Mental Status: He is alert and oriented to person, place, and time.     Coordination: Coordination normal.     ED Results / Procedures / Treatments   Labs (all labs ordered are listed, but only abnormal results are displayed) Labs Reviewed  RESP PANEL BY RT-PCR (RSV, FLU A&B, COVID)  [id number]    EKG None  Radiology No results found.  Procedures Procedures    Medications Ordered in ED Medications  ketorolac (TORADOL) 30 MG/ML injection 30 mg (has no administration in time range)  sodium chloride 0.9 % bolus 1,000 mL (has no administration in time range)  acetaminophen (TYLENOL) tablet 650 mg (650 mg Oral Given 01/19/22 0050)    ED Course/ Medical Decision Making/ A&P  Patient presenting here with complaints of back pain, fever, cough, and generalized bodyaches starting earlier today.  Patient arrives here febrile with temp of 100.8, but otherwise unremarkable physical exam.  CBC and metabolic panel obtained showing no acute abnormality.  TSH obtained at the patient's request is pending.  Respiratory panel comes back positive for influenza A.  Urinalysis not consistent with infection.  Patient hydrated with normal saline and seems to be feeling better.  At this point, I feel as though influenza is the cause of his back pain, fever, and other symptoms.  He will be discharged with Tamiflu, rotating Tylenol/Motrin, increase fluid intake, and follow-up as needed.  Final Clinical Impression(s) / ED Diagnoses Final diagnoses:  None    Rx / DC Orders ED Discharge Orders     None         Geoffery Lyons, MD 01/19/22 [phone]

## 2022-01-19 NOTE — ED Notes (Signed)
Pt c/o back pain started today, also fever and cold like symptoms

## 2022-01-19 NOTE — Discharge Instructions (Signed)
Begin taking Tamiflu as prescribed.  Take Tylenol 1000 mg rotated with ibuprofen 600 mg every 4 hours as needed for pain or fever.  Take over-the-counter medications as needed for relief of symptoms.  Return to the emergency department if you develop severe chest pain, difficulty breathing, or for other new and concerning symptoms.

## 2022-04-27 ENCOUNTER — Ambulatory Visit
Admission: EM | Admit: 2022-04-27 | Discharge: 2022-04-27 | Disposition: A | Discharge status: Home / Self Care | Payer: No Typology Code available for payment source | Attending: Family Medicine | Admitting: Family Medicine

## 2022-04-27 DIAGNOSIS — S61411A Laceration without foreign body of right hand, initial encounter: Secondary | ICD-10-CM

## 2022-04-27 MED ORDER — CEPHALEXIN 500 MG PO CAPS: 500.0000 mg | ORAL_CAPSULE | Freq: Three times a day (TID) | ORAL | 0 refills | Status: AC

## 2022-04-27 NOTE — Discharge Instructions (Signed)
Take cephalexin 500 mg--1 capsule 3 times daily for 5 days  1-2 times daily clean the wound with soapy water or peroxide, and put new triple antibiotic ointment on.  And put a new dry dressing or bandage on.

## 2022-04-27 NOTE — ED Provider Notes (Signed)
[facility]    CSN: [id number] Arrival date & time: 04/27/22  1610      History   Chief Complaint Chief Complaint  Patient presents with   box cutter injury    HPI Ricardo Forbes is a 57 y.o. male.   HPI Here for a cut to his the right palm.  In the early afternoon yesterday he was using a box cutter to trim something around the mirror in the bathroom at his house.  He slipped and cut his right hand he bandaged it and went to work today.  At work the bandage loosened and the cut opened up and it bled a lot.  It is currently about 1700.  His last tetanus was 1 year ago.  Past Medical History:  Diagnosis Date   Diabetes mellitus without complication    Hyperlipidemia    past hx- currently on medication    Hypertension    currently being treated and under control    Thyroid disease     Patient Active Problem List   Diagnosis Date Noted   Erectile dysfunction associated with type 2 diabetes mellitus 05/30/2010   ABSCESS, TOOTH 12/04/2009   Diabetes (HCC) 06/14/2009   Hyperlipidemia 02/11/2009   Hypothyroidism 02/01/2009   Diabetes mellitus 02/01/2009   PERIPHERAL NEUROPATHY 02/01/2009   BORDERLINE GLAUCOMA WITH OCULAR HYPERTENSION 02/01/2009   HYPERTENSION, BENIGN ESSENTIAL 02/01/2009   UNSPECIFIED DENTAL CARIES 02/01/2009    Past Surgical History:  Procedure Laterality Date   DENTAL SURGERY         Home Medications    Prior to Admission medications   Medication Sig Start Date End Date Taking? Authorizing Provider  cephALEXin (KEFLEX) 500 MG capsule Take 1 capsule (500 mg total) by mouth 3 (three) times daily for 5 days. 04/27/22 05/02/22 Yes Sanii Kukla, Janace Aris, MD  TRULICITY 3 MG/0.5ML SOPN Inject into the skin. 03/16/22  Yes [provider]  Blood Glucose Monitoring Suppl (CONTOUR NEXT MONITOR) w/Device KIT 1 each by Does not apply route 3 (three) times daily. 09/05/18   Hoy Register, MD  fluticasone (FLONASE) 50 MCG/ACT nasal  spray Place 2 sprays into both nostrils daily. 08/15/19   Cathie Hoops, Amy V, PA-C  glipiZIDE (GLUCOTROL) 10 MG tablet Take 1 tablet (10 mg total) by mouth 2 (two) times daily before a meal. TAKE ONE TABLET BY MOUTH TWICE DAILY BEFORE MEAL(S) 12/19/18   Debby Bud, MD  glucose blood (CONTOUR NEXT TEST) test strip Use as instructed 09/05/18   Hoy Register, MD  Insulin Glargine (LANTUS SOLOSTAR) 100 UNIT/ML Solostar Pen Inject 14 Units into the skin 2 (two) times daily. 01/24/19   Hoy Register, MD  Insulin Pen Needle (B-D UF III MINI PEN NEEDLES) 31G X 5 MM MISC Use as directed once daily 12/15/16   Hoy Register, MD  ipratropium (ATROVENT) 0.06 % nasal spray Place 2 sprays into both nostrils 4 (four) times daily. 08/15/19   Cathie Hoops, Amy V, PA-C  levothyroxine (SYNTHROID) 100 MCG tablet TAKE ONE TABLET BY MOUTH ONCE DAILY BEFORE BREAKFAST 06/09/19   Hoy Register, MD  lisinopril (ZESTRIL) 2.5 MG tablet Take 1 tablet (2.5 mg total) by mouth daily. 01/24/19   Hoy Register, MD  lovastatin (MEVACOR) 20 MG tablet Take 1 tablet (20 mg total) by mouth at bedtime. 01/24/19   Hoy Register, MD  metFORMIN (GLUCOPHAGE) 500 MG tablet Take 3 tablets by mouth in the morning and 2 in the evening with food. 01/24/19   Hoy Register, MD  TRUEPLUS LANCETS  28G MISC 1 each by Does not apply route 3 (three) times daily before meals. 03/15/17   Hoy Register, MD    Family History Family History  Problem Relation Age of Onset   Colon cancer Neg Hx    Colon polyps Neg Hx    Esophageal cancer Neg Hx    Rectal cancer Neg Hx    Stomach cancer Neg Hx     Social History Social History   Tobacco Use   Smoking status: Never   Smokeless tobacco: Never  Vaping Use   Vaping Use: Never used  Substance Use Topics   Alcohol use: No   Drug use: No     Allergies   Patient has no known allergies.   Review of Systems Review of Systems   Physical Exam Triage Vital Signs ED Triage Vitals  Enc Vitals Group     BP  04/27/22 1652 125/77     Pulse Rate 04/27/22 1652 63     Resp 04/27/22 1652 20     Temp 04/27/22 1652 98 F (36.7 C)     Temp Source 04/27/22 1652 Oral     SpO2 04/27/22 1652 97 %     Weight --      Height --      Head Circumference --      Peak Flow --      Pain Score 04/27/22 1657 10     Pain Loc --      Pain Edu? --      Excl. in GC? --    No data found.  Updated Vital Signs BP 125/77 (BP Location: Right Arm)   Pulse 63   Temp 98 F (36.7 C) (Oral)   Resp 20   SpO2 97%   Visual Acuity Right Eye Distance:   Left Eye Distance:   Bilateral Distance:    Right Eye Near:   Left Eye Near:    Bilateral Near:     Physical Exam Vitals reviewed.  Constitutional:      General: He is not in acute distress.    Appearance: He is not ill-appearing, toxic-appearing or diaphoretic.  Skin:    Coloration: Skin is not jaundiced or pale.     Comments: On his right palm there is a linear laceration that is about 3 cm in length.  The central portion is a little bit deeper and wanting to gape just a little, about 1 mm.  Neurological:     General: No focal deficit present.     Mental Status: He is alert and oriented to person, place, and time.  Psychiatric:        Behavior: Behavior normal.      UC Treatments / Results  Labs (all labs ordered are listed, but only abnormal results are displayed) Labs Reviewed - No data to display  EKG   Radiology No results found.  Procedures Procedures (including critical care time)  Medications Ordered in UC Medications - No data to display  Initial Impression / Assessment and Plan / UC Course  I have reviewed the triage vital signs and the nursing notes.  Pertinent labs & imaging results that were available during my care of the patient were reviewed by me and considered in my medical decision making (see chart for details).        He is about 27 hours post injury.  I discussed with him that we cannot good conscience try to  do wound closure at this point, as it would  put him at risk for wound infection and it is unlikely for the skin edges to heal together at this point.  The wound was cleaned with peroxide and bandages applied with gauze and bacitracin.  Wound care is explained.  Oral antibiotics are sent in to take for the next 5 days to prevent infection, as he does have longstanding diabetes.    Final Clinical Impressions(s) / UC Diagnoses   Final diagnoses:  Laceration of right hand without foreign body, initial encounter     Discharge Instructions      Take cephalexin 500 mg--1 capsule 3 times daily for 5 days  1-2 times daily clean the wound with soapy water or peroxide, and put new triple antibiotic ointment on.  And put a new dry dressing or bandage on.      ED Prescriptions     Medication Sig Dispense Auth. Provider   cephALEXin (KEFLEX) 500 MG capsule Take 1 capsule (500 mg total) by mouth 3 (three) times daily for 5 days. 15 capsule Marlinda Mike Janace Aris, MD      PDMP not reviewed this encounter.   Zenia Resides, MD 04/27/22 1728

## 2022-04-27 NOTE — ED Triage Notes (Signed)
Pt c/o box cutter injury to right hand yesterday that spontaneously clotted but today reopened with minimal activity.

## 2023-06-01 ENCOUNTER — Ambulatory Visit
Admission: EM | Admit: 2023-06-01 | Discharge: 2023-06-01 | Disposition: A | Discharge status: Home / Self Care | Payer: Worker's Compensation | Attending: Family Medicine | Admitting: Family Medicine

## 2023-06-01 ENCOUNTER — Other Ambulatory Visit: Payer: Self-pay

## 2023-06-01 ENCOUNTER — Encounter: Payer: Self-pay | Admitting: Emergency Medicine

## 2023-06-01 DIAGNOSIS — Z13228 Encounter for screening for other metabolic disorders: Secondary | ICD-10-CM | POA: Insufficient documentation

## 2023-06-01 DIAGNOSIS — S80811A Abrasion, right lower leg, initial encounter: Secondary | ICD-10-CM

## 2023-06-01 NOTE — ED Triage Notes (Signed)
 Pt here for laceration to right shin; pt had steri strips applied and no active bleeding noted

## 2023-06-01 NOTE — ED Provider Notes (Signed)
 [facility]    CSN: [id number] Arrival date & time: 06/01/23  1749      History   Chief Complaint Chief Complaint  Patient presents with   Laceration    HPI Ricardo Forbes is a 58 y.o. male.    Laceration Here for a cut to his right shin.  This was sustained about 12 hours ago.  Something at work struck him in his right shin and caused a wound.  There were some ENT personnel at one of his work sites and they cleaned the wound and bandaged it with Steri-Strips.  He decided to come let us  check it out after he got off work.  Last tetanus by history was 2 years ago.  He does have diabetes and sugars have been "up-and-down".  Past Medical History:  Diagnosis Date   Diabetes mellitus without complication (HCC)    Hyperlipidemia    past hx- currently on medication    Hypertension    currently being treated and under control    Thyroid  disease     Patient Active Problem List   Diagnosis Date Noted   Encounter for screening for other metabolic disorders 06/01/2023   Type 2 diabetes mellitus with hyperglycemia, with long-term current use of insulin  (HCC) 06/19/2019   Erectile dysfunction 05/30/2010   ABSCESS, TOOTH 12/04/2009   Diabetes (HCC) 06/14/2009   Other hyperlipidemia 02/11/2009   Acquired hypothyroidism 02/01/2009   Diabetes mellitus (HCC) 02/01/2009   Hereditary and idiopathic peripheral neuropathy 02/01/2009   BORDERLINE GLAUCOMA WITH OCULAR HYPERTENSION 02/01/2009   Essential hypertension 02/01/2009   Dental caries 02/01/2009    Past Surgical History:  Procedure Laterality Date   DENTAL SURGERY         Home Medications    Prior to Admission medications   Medication Sig Start Date End Date Taking? Authorizing Provider  rosuvastatin  (CRESTOR ) 20 MG tablet Take 20 mg by mouth daily. 03/18/23  Yes [provider]  Blood Glucose Monitoring Suppl (CONTOUR NEXT MONITOR) w/Device KIT 1 each by Does not apply route 3 (three) times  daily. 09/05/18   Joaquin Mulberry, MD  Continuous Glucose Receiver (FREESTYLE LIBRE 14 DAY READER) DEVI Use for 14 days to check sugars daily 02/09/20   [provider]  Continuous Glucose Sensor (FREESTYLE LIBRE 14 DAY SENSOR) MISC Use for 14 days to check sugars daily 02/09/20   [provider]  fluticasone  (FLONASE ) 50 MCG/ACT nasal spray Place 2 sprays into both nostrils daily. 08/15/19   Wilhelmenia Harada, Amy V, PA-C  glipiZIDE  (GLUCOTROL ) 10 MG tablet Take 1 tablet (10 mg total) by mouth 2 (two) times daily before a meal. TAKE ONE TABLET BY MOUTH TWICE DAILY BEFORE MEAL(S) 12/19/18   Ardith Krill, MD  glucose blood (CONTOUR NEXT TEST) test strip Use as instructed 09/05/18   Newlin, Enobong, MD  Insulin  Glargine (LANTUS  SOLOSTAR) 100 UNIT/ML Solostar Pen Inject 14 Units into the skin 2 (two) times daily. 01/24/19   Newlin, Enobong, MD  Insulin  Pen Needle (B-D UF III MINI PEN NEEDLES) 31G X 5 MM MISC Use as directed once daily 12/15/16   Newlin, Enobong, MD  ipratropium (ATROVENT ) 0.06 % nasal spray Place 2 sprays into both nostrils 4 (four) times daily. 08/15/19   Wilhelmenia Harada, Amy V, PA-C  levothyroxine  (SYNTHROID ) 100 MCG tablet TAKE ONE TABLET BY MOUTH ONCE DAILY BEFORE BREAKFAST 06/09/19   Newlin, Enobong, MD  lisinopril  (ZESTRIL ) 2.5 MG tablet Take 1 tablet (2.5 mg total) by mouth daily. 01/24/19   Newlin,  Enobong, MD  lovastatin  (MEVACOR ) 20 MG tablet Take 1 tablet (20 mg total) by mouth at bedtime. 01/24/19   Newlin, Enobong, MD  metFORMIN  (GLUCOPHAGE ) 500 MG tablet Take 3 tablets by mouth in the morning and 2 in the evening with food. 01/24/19   Newlin, Enobong, MD  pravastatin  (PRAVACHOL ) 80 MG tablet Take 80 mg by mouth daily. 03/17/22   [provider]  tadalafil (CIALIS) 10 MG tablet Take 10 mg by mouth daily as needed. 03/16/22   [provider]  TRUEPLUS LANCETS 28G MISC 1 each by Does not apply route 3 (three) times daily before meals. 03/15/17   Newlin, Enobong, MD  TRULICITY 3  MG/0.5ML SOPN Inject into the skin. 03/16/22   [provider]    Family History Family History  Problem Relation Age of Onset   Colon cancer Neg Hx    Colon polyps Neg Hx    Esophageal cancer Neg Hx    Rectal cancer Neg Hx    Stomach cancer Neg Hx     Social History Social History   Tobacco Use   Smoking status: Never   Smokeless tobacco: Never  Vaping Use   Vaping status: Never Used  Substance Use Topics   Alcohol use: No   Drug use: No     Allergies   Lovastatin  and Pioglitazone   Review of Systems Review of Systems   Physical Exam Triage Vital Signs ED Triage Vitals  Encounter Vitals Group     BP 06/01/23 1842 (!) 149/87     Systolic BP Percentile --      Diastolic BP Percentile --      Pulse Rate 06/01/23 1842 75     Resp 06/01/23 1842 18     Temp 06/01/23 1842 98.6 F (37 C)     Temp Source 06/01/23 1842 Oral     SpO2 06/01/23 1842 96 %     Weight --      Height --      Head Circumference --      Peak Flow --      Pain Score 06/01/23 1843 5     Pain Loc --      Pain Education --      Exclude from Growth Chart --    No data found.  Updated Vital Signs BP (!) 149/87 (BP Location: Left Arm)   Pulse 75   Temp 98.6 F (37 C) (Oral)   Resp 18   SpO2 96%   Visual Acuity Right Eye Distance:   Left Eye Distance:   Bilateral Distance:    Right Eye Near:   Left Eye Near:    Bilateral Near:     Physical Exam Vitals reviewed.  Constitutional:      General: He is not in acute distress.    Appearance: He is not ill-appearing, toxic-appearing or diaphoretic.  Skin:    Coloration: Skin is not pale.     Comments: There are some quadrant Steri-Strips across a wound on his right shin.  The wound appears to be about 5 cm in length.  It is not bleeding.  There is no swelling.  The superior portion that is visible, about half centimeter, appears to be more of a an abrasion than a laceration.  It is about half centimeter wide.  Neurological:      General: No focal deficit present.     Mental Status: He is alert and oriented to person, place, and time.  Psychiatric:  Behavior: Behavior normal.      UC Treatments / Results  Labs (all labs ordered are listed, but only abnormal results are displayed) Labs Reviewed - No data to display  EKG   Radiology No results found.  Procedures Procedures (including critical care time)  Medications Ordered in UC Medications - No data to display  Initial Impression / Assessment and Plan / UC Course  I have reviewed the triage vital signs and the nursing notes.  Pertinent labs & imaging results that were available during my care of the patient were reviewed by me and considered in my medical decision making (see chart for details).     We discussed taking Steri-Strips off and seeing if he needed sutures or leaving it is a is since it is not bleeding and is not swelling.  We decided to leave the Steri-Strips were they are.  Tetanus is up-to-date.  Discussed wound care with him. Discussed with him to control his diabetes in his diet as well as he can in the next few days.  Final Clinical Impressions(s) / UC Diagnoses   Final diagnoses:  None   Discharge Instructions   None    ED Prescriptions   None    PDMP not reviewed this encounter.   Ann Keto, MD 06/01/23 [phone]

## 2023-06-01 NOTE — Discharge Instructions (Signed)
 Wash the area with soapy water 1-2 times daily.  Be as good as you can on your diet to control your diabetes as well as you can in the next few days.  Ice and elevate the sore leg tonight and tomorrow when you can.
